# Patient Record
Sex: Female | Born: 1965 | Race: Black or African American | Hispanic: No | Marital: Married | Smoking: Never smoker
Health system: Southern US, Community
[De-identification: ages and names within clinical notes are randomized; demographics above are authoritative.]

## PROBLEM LIST (undated history)

## (undated) DIAGNOSIS — F32A Depression, unspecified: Secondary | ICD-10-CM

## (undated) DIAGNOSIS — I10 Essential (primary) hypertension: Secondary | ICD-10-CM

## (undated) DIAGNOSIS — D649 Anemia, unspecified: Secondary | ICD-10-CM

## (undated) DIAGNOSIS — F419 Anxiety disorder, unspecified: Secondary | ICD-10-CM

## (undated) DIAGNOSIS — R51 Headache: Secondary | ICD-10-CM

## (undated) DIAGNOSIS — F329 Major depressive disorder, single episode, unspecified: Secondary | ICD-10-CM

## (undated) HISTORY — PX: CHOLECYSTECTOMY: SHX55

---

## 1995-08-02 HISTORY — PX: AXILLARY SURGERY: SHX892

## 1998-08-27 ENCOUNTER — Other Ambulatory Visit: Admission: RE | Admit: 1998-08-27 | Discharge: 1998-08-27 | Payer: Self-pay | Admitting: *Deleted

## 1999-01-01 ENCOUNTER — Inpatient Hospital Stay (HOSPITAL_COMMUNITY): Admission: EM | Admit: 1999-01-01 | Discharge: 1999-01-01 | Payer: Self-pay | Admitting: Family Medicine

## 1999-01-19 ENCOUNTER — Encounter: Payer: Self-pay | Admitting: Family Medicine

## 1999-01-19 ENCOUNTER — Ambulatory Visit (HOSPITAL_COMMUNITY): Admission: RE | Admit: 1999-01-19 | Discharge: 1999-01-19 | Payer: Self-pay | Admitting: Family Medicine

## 1999-01-29 ENCOUNTER — Encounter: Admission: RE | Admit: 1999-01-29 | Discharge: 1999-04-29 | Payer: Self-pay | Admitting: Family Medicine

## 1999-09-28 ENCOUNTER — Encounter: Admission: RE | Admit: 1999-09-28 | Discharge: 1999-12-27 | Payer: Self-pay | Admitting: Family Medicine

## 2000-08-01 HISTORY — PX: GASTRIC BYPASS: SHX52

## 2000-09-15 ENCOUNTER — Encounter: Admission: RE | Admit: 2000-09-15 | Discharge: 2000-12-14 | Payer: Self-pay | Admitting: Family Medicine

## 2000-10-17 ENCOUNTER — Ambulatory Visit (HOSPITAL_BASED_OUTPATIENT_CLINIC_OR_DEPARTMENT_OTHER): Admission: RE | Admit: 2000-10-17 | Discharge: 2000-10-17 | Payer: Self-pay | Admitting: Plastic Surgery

## 2001-03-23 ENCOUNTER — Encounter: Admission: RE | Admit: 2001-03-23 | Discharge: 2001-06-21 | Payer: Self-pay | Admitting: Family Medicine

## 2002-02-12 ENCOUNTER — Other Ambulatory Visit: Admission: RE | Admit: 2002-02-12 | Discharge: 2002-02-12 | Payer: Self-pay | Admitting: Family Medicine

## 2002-03-05 ENCOUNTER — Encounter: Admission: RE | Admit: 2002-03-05 | Discharge: 2002-06-03 | Payer: Self-pay | Admitting: Family Medicine

## 2003-02-26 ENCOUNTER — Other Ambulatory Visit: Admission: RE | Admit: 2003-02-26 | Discharge: 2003-02-26 | Payer: Self-pay | Admitting: Family Medicine

## 2003-11-13 ENCOUNTER — Encounter: Admission: RE | Admit: 2003-11-13 | Discharge: 2003-11-13 | Payer: Self-pay | Admitting: Family Medicine

## 2004-03-25 ENCOUNTER — Other Ambulatory Visit: Admission: RE | Admit: 2004-03-25 | Discharge: 2004-03-25 | Payer: Self-pay | Admitting: Family Medicine

## 2004-05-14 ENCOUNTER — Encounter (INDEPENDENT_AMBULATORY_CARE_PROVIDER_SITE_OTHER): Payer: Self-pay | Admitting: Specialist

## 2004-05-14 ENCOUNTER — Ambulatory Visit (HOSPITAL_COMMUNITY): Admission: RE | Admit: 2004-05-14 | Discharge: 2004-05-14 | Payer: Self-pay | Admitting: General Surgery

## 2005-08-17 ENCOUNTER — Other Ambulatory Visit: Admission: RE | Admit: 2005-08-17 | Discharge: 2005-08-17 | Payer: Self-pay | Admitting: Family Medicine

## 2005-09-27 ENCOUNTER — Emergency Department (HOSPITAL_COMMUNITY): Admission: EM | Admit: 2005-09-27 | Discharge: 2005-09-27 | Payer: Self-pay | Admitting: Emergency Medicine

## 2006-08-11 ENCOUNTER — Encounter: Admission: RE | Admit: 2006-08-11 | Discharge: 2006-08-11 | Payer: Self-pay | Admitting: Family Medicine

## 2006-08-22 ENCOUNTER — Encounter: Admission: RE | Admit: 2006-08-22 | Discharge: 2006-10-16 | Payer: Self-pay | Admitting: Family Medicine

## 2007-06-12 ENCOUNTER — Encounter: Admission: RE | Admit: 2007-06-12 | Discharge: 2007-06-12 | Payer: Self-pay | Admitting: Family Medicine

## 2008-03-20 ENCOUNTER — Inpatient Hospital Stay (HOSPITAL_COMMUNITY): Admission: AD | Admit: 2008-03-20 | Discharge: 2008-03-20 | Payer: Self-pay | Admitting: Obstetrics and Gynecology

## 2008-03-28 ENCOUNTER — Inpatient Hospital Stay (HOSPITAL_COMMUNITY): Admission: AD | Admit: 2008-03-28 | Discharge: 2008-03-28 | Payer: Self-pay | Admitting: Obstetrics and Gynecology

## 2008-04-01 ENCOUNTER — Inpatient Hospital Stay (HOSPITAL_COMMUNITY): Admission: AD | Admit: 2008-04-01 | Discharge: 2008-04-03 | Payer: Self-pay | Admitting: Obstetrics and Gynecology

## 2008-04-07 ENCOUNTER — Inpatient Hospital Stay (HOSPITAL_COMMUNITY): Admission: AD | Admit: 2008-04-07 | Discharge: 2008-04-10 | Payer: Self-pay | Admitting: Obstetrics and Gynecology

## 2008-04-08 ENCOUNTER — Encounter (INDEPENDENT_AMBULATORY_CARE_PROVIDER_SITE_OTHER): Payer: Self-pay | Admitting: Obstetrics and Gynecology

## 2008-04-10 ENCOUNTER — Encounter (INDEPENDENT_AMBULATORY_CARE_PROVIDER_SITE_OTHER): Payer: Self-pay | Admitting: Obstetrics and Gynecology

## 2009-11-18 ENCOUNTER — Encounter: Admission: RE | Admit: 2009-11-18 | Discharge: 2009-11-18 | Payer: Self-pay | Admitting: Family Medicine

## 2010-08-22 ENCOUNTER — Encounter: Payer: Self-pay | Admitting: Family Medicine

## 2010-11-01 ENCOUNTER — Emergency Department (HOSPITAL_COMMUNITY): Payer: Managed Care, Other (non HMO)

## 2010-11-01 ENCOUNTER — Emergency Department (HOSPITAL_COMMUNITY)
Admission: EM | Admit: 2010-11-01 | Discharge: 2010-11-01 | Disposition: A | Discharge status: Home / Self Care | Payer: Managed Care, Other (non HMO) | Attending: Emergency Medicine | Admitting: Emergency Medicine

## 2010-11-01 DIAGNOSIS — E860 Dehydration: Secondary | ICD-10-CM | POA: Insufficient documentation

## 2010-11-01 DIAGNOSIS — Z79899 Other long term (current) drug therapy: Secondary | ICD-10-CM | POA: Insufficient documentation

## 2010-11-01 DIAGNOSIS — R112 Nausea with vomiting, unspecified: Secondary | ICD-10-CM | POA: Insufficient documentation

## 2010-11-01 DIAGNOSIS — K5289 Other specified noninfective gastroenteritis and colitis: Secondary | ICD-10-CM | POA: Insufficient documentation

## 2010-11-01 DIAGNOSIS — Z9884 Bariatric surgery status: Secondary | ICD-10-CM | POA: Insufficient documentation

## 2010-11-01 DIAGNOSIS — R10819 Abdominal tenderness, unspecified site: Secondary | ICD-10-CM | POA: Insufficient documentation

## 2010-11-01 DIAGNOSIS — I10 Essential (primary) hypertension: Secondary | ICD-10-CM | POA: Insufficient documentation

## 2010-11-01 DIAGNOSIS — F411 Generalized anxiety disorder: Secondary | ICD-10-CM | POA: Insufficient documentation

## 2010-11-01 DIAGNOSIS — R197 Diarrhea, unspecified: Secondary | ICD-10-CM | POA: Insufficient documentation

## 2010-11-01 LAB — COMPREHENSIVE METABOLIC PANEL
AST: 28 U/L (ref 0–37)
Albumin: 3.6 g/dL (ref 3.5–5.2)
Alkaline Phosphatase: 83 U/L (ref 39–117)
CO2: 23 mEq/L (ref 19–32)
Chloride: 102 mEq/L (ref 96–112)
GFR calc Af Amer: >60 mL/min (ref >60)
Glucose, Bld: 104 mg/dL — ABNORMAL HIGH (ref 70–99)
Potassium: 3.8 mEq/L (ref 3.5–5.1)
Total Protein: 7.2 g/dL (ref 6.0–8.3)

## 2010-11-01 LAB — CBC
HCT: 39.6 % (ref 36.0–46.0)
Hemoglobin: 13.4 g/dL (ref 12.0–15.0)
MCHC: 33.8 g/dL (ref 30.0–36.0)
MCV: 88.4 fL (ref 78.0–100.0)
RBC: 4.48 MIL/uL (ref 3.87–5.11)
RDW: 13.9 % (ref 11.5–15.5)
WBC: 10 10*3/uL (ref 4.0–10.5)

## 2010-11-01 LAB — DIFFERENTIAL
Basophils Absolute: 0 10*3/uL (ref 0.0–0.1)
Eosinophils Relative: 0 % (ref 0–5)
Lymphs Abs: 0.4 10*3/uL — ABNORMAL LOW (ref 0.7–4.0)
Monocytes Absolute: 0.2 10*3/uL (ref 0.1–1.0)
Neutro Abs: 9.3 10*3/uL — ABNORMAL HIGH (ref 1.7–7.7)

## 2010-11-01 LAB — URINE MICROSCOPIC-ADD ON

## 2010-11-01 LAB — URINALYSIS, ROUTINE W REFLEX MICROSCOPIC
Bilirubin Urine: NEGATIVE
Nitrite: NEGATIVE

## 2010-12-14 NOTE — Op Note (Signed)
NAMESHIRLA, HODGKISS NO.:  [id number]   MEDICAL RECORD NO.:  [id number]          PATIENT TYPE:  INP   LOCATION:  9125                          FACILITY:  [facility]   PHYSICIAN:  Janine Limbo, M.D.DATE OF BIRTH:  1966/06/07   DATE OF PROCEDURE:  04/10/2008  DATE OF DISCHARGE:  04/10/2008                               OPERATIVE REPORT   PREOPERATIVE DIAGNOSES:  1. Postpartum day #2.  2. Desires sterilization.   POSTOPERATIVE DIAGNOSES:  1. Postpartum day #2.  2. Desires sterilization.   PROCEDURE:  Postpartum modified [place] bilateral tubal ligation.   SURGEON:  Janine Limbo, MD.   FIRST ASSISTANT:  None.   ANESTHETIC:  General.   DISPOSITION:  Ms. Lasseigne is a 45 year old female, para 2-0-0-3, who had a  vaginal delivery of twins on April 08, 2008.  She desires  sterilization.  She understands the indications for her surgical  procedure and she accepts the risks of, but not limited to, anesthetic  complications, bleeding, infections, possible damage to the surrounding  organs, and possible tubal failure (17/1000).   FINDINGS:  The fallopian tubes were normal bilaterally.   DESCRIPTION OF PROCEDURE:  The patient was taken to the operating room  where a general anesthetic was given.  The patient's abdomen was prepped  with multiple layers of Betadine and then sterilely draped.  The  subumbilical area was injected with 0.5% Marcaine with epinephrine.  A  subumbilical incision was made and carried sharply through the  subcutaneous tissue, the fascia, and the anterior peritoneum.  The left  fallopian tube was identified and followed to its fimbriated end.  A  knuckle of tube was made on the left using a free tie and then a suture  ligature of 0 plain catgut.  The knuckle of tube thus made, was excised.  Hemostasis was adequate.  An identical procedure was carried out on the  opposite side.  Again hemostasis was adequate.  All instruments were  removed.  The anterior peritoneum and the fascia were closed using a  running suture of 0 Vicryl.  The skin was reapproximated using a  subcuticular suture of 4-0 Vicryl.  Sponge, needle, and instrument  counts were correct on 2  occasions.  The estimated blood loss for the procedure was 1 mL.  The  patient tolerated her procedure well.  She was awakened from her  anesthetic without difficulty and taken to the recovery room in stable  condition.  The 2 portions of the fallopian tubes were sent to Pathology  for evaluation.      Janine Limbo, M.D.  Electronically Signed     AVS/MEDQ  D:  04/10/2008  T:  04/11/2008  Job:  [id number]

## 2010-12-14 NOTE — Discharge Summary (Signed)
Chelsey, [name] NO.:  [id number]   MEDICAL RECORD NO.:  [id number]          PATIENT TYPE:  INP   LOCATION:  9153                          [facility]  [facility]   PHYSICIAN:  Hal Morales, Chelsey.D.DATE OF BIRTH:  [birth year]   DATE OF ADMISSION:  04/01/2008  DATE OF DISCHARGE:  04/03/2008                               DISCHARGE SUMMARY   ADMITTING DIAGNOSES:  1. Twin intrauterine gestation at 36-6/7 weeks.  2. Chronic hypertension, rule out superimposed preeclampsia.   DISCHARGE DIAGNOSES:  1. Intrauterine pregnancy, twin gestation, dichorionic, diamniotic, at      37-1/7 weeks.  2. Baby A with unstable fetal lie.  3. Chronic hypertension, stable without medications.  4. Probable gestational thrombocytopenia.  5. The patient with desire for vaginal birth.  6. BPP 8/8 for twin A and twin B on April 02, 2008.  7. Twin B with occasional mild variables.   PROCEDURES:  1. BPPx2 both on September 1 and September 2.  2. External monitoring.  3. Preeclampsia labs on September 1 and 2.   Chelsey [name] is a 45 year old gravida 2, para 1-0-0-1 who presented on the  day of admission at 36-6/7 weeks for [facility] evaluation.  She was seen in  the office as a work-in for increased edema, headache, and was  complaining of contractions every 10 minutes and just generally not  feeling well.  Cervix was 1 to 2 cm long and -2.  She had ultrasound  last on August 26,  showing vertex and breech presentation with  appropriate fetal growth.  Pregnancy has been followed by MD service at  [facility], and pregnancy is remarkable for:  1. Advanced maternal age.  2. Latex allergy.  3. Twins, di-di.  4. History of PIH and now chronic hypertension requiring no      medication.  5. Unsure dates.  6. History of gastric bypass.   Urine was negative for protein at the office.  CBC showed a hemoglobin  of 13.2, platelets were 96,000 on admission.  CMET within normal limits  with the exception of AST  slightly high at 38 and ALT slightly high at  39.  Uric acid was 5.5.  She was admitted to the antenatal unit to begin  a 24-hour urine to further evaluate for preeclampsia.  She was going to  have repeat BPP in the morning of the 2nd, NST every shift and as well  repeat her PIH labs in the morning of September 2.  Later after  admission after supper on the 1st, the patient was doing better.  She  was without complaints.  BPPs after admission:  Twin A was 8/8 and twin  B was 6/8 and got -2 for no fetal breathing movements.  The morning of  September 2, the patient was doing well.  She reported good fetal  movement x2.  She had sporadic mild contractions, no headache, no visual  changes, no right upper quadrant pain.  She was without other  complaints.  Her blood pressures were all less than 140/90, and other  vital signs were stable.  Baby  A was reactive.  Baby B was also reactive  with some occasional variables.  Extremities remained with 3+ edema  bilaterally.  DTRs 1+ and no clonus.  Sonogram repeat showed baby A in  transverse lie with a BPP of 8/8 and normal AFI, and B was vertex with a  BPP of 8/8 and normal AFI.  Her repeat labs:  CMP was normal.  CBC  showed continued decrease in her platelets to 84,000, which the previous  day were 96,000.  Uric acid was stable but slight increase to 5.6 from  5.5, and 24-hour urine continued.  Subsequently 24-hour urine showed a  total protein of 75.  The plan was made to further discuss the patient's  plan of care with Dr. Pennie Rushing, her primary.  She did receive pastoral  care and care management while she was hospitalized.  On September 3, at  37 and 1, the patient was still without complaints.  No strong  contractions.  Her vital signs remained stable.  Blood pressure range  was systolics 126 to 133 over diastolics of 71 to 86.  Cervix was  unchanged, 2 cm, 3%, -2 with a questionable presenting part.  Hemoglobin  on September 3 was 11.7.  NSTs  remained reactive.  Contractions remained  approximately 6 per hour.  Plan of care was discussed with the patient.  The patient verbalized desire for a vaginal delivery if possible.  Since  Baby A was transverse, she decline delivery at this time.  In light of  the absence of other markers of pre-eclampsia, the patient's  thrombocytopenia was thought to be gestational.  On the evening of the  3rd, the patient continued to be without complaints, was ambulating  without difficulty.  Blood pressure after ambulation was 150/77 and a  recheck was 126/57.  The patient was given the option of going home or  remaining on the antenatal unit for continued observation and did voice  desire to go home.  She was deemed to have received full benefit of her  hospital stay and was discharged home in stable condition.   DISCHARGE FOLLOWUP:  She is going to return on Monday for an NST for Dr.  Pennie Rushing to follow up and also to have a repeat platelet count.  Platelet  count is to be on the 4th, which is tomorrow, and the NST is to be on  Monday on the 7th.  She was given labor precautions and fetal kick  counts and to follow up p.r.n.   MEDICATIONS:  She is just on a prenatal vitamin 1 tablet p.o. daily.   The plan of care by Dr. Pennie Rushing was to consider delivery at 38 weeks or  when baby A is in vertex presentation.      Candice Denny Levy, CNM      ______________________________  Hal Morales, Chelsey.D.    CHS/MEDQ  D:  04/03/2008  T:  04/03/2008  Job:  [id number]

## 2010-12-14 NOTE — H&P (Signed)
NAMEADRI, Chelsey [name] NO.:  [id number]   MEDICAL RECORD NO.:  [id number]          PATIENT TYPE:  INP   LOCATION:  9153                          [facility]   PHYSICIAN:  Osborn Coho, M.D.   DATE OF BIRTH:  1965/08/10   DATE OF ADMISSION:  04/01/2008  DATE OF DISCHARGE:  04/03/2008                              HISTORY & PHYSICAL   HISTORY:  Chelsey [name] is a 45 year old gravida 2, para 1-0-0-1 at 36-6/7  weeks who presents today with twins after having been seen in the office  today with mild elevations of blood pressure and headache.   Her pregnancy has been remarkable for:  1. Twin gestation  2. History of PIH with her last pregnancy not requiring any      medication.  3. Advanced maternal age with genetic testing declined.  4. History of chronic hypertension in the past.  5. Latex allergy.  6. Questionable dates with [facility] by first trimester ultrasound on October 23, 2007 at 18 weeks by LMP.  7. Diamniotic dichorionic twins.  8. History of gastric bypass surgery in the past.   LABORATORY DATA:  Prenatal labs:  Blood type is A+, Rh antibody  negative, VDRL nonreactive, rubella titer positive, hepatitis B surface  antigen negative, HIV is nonreactive.  Sickle cell test was negative.  Cystic fibrosis testing was negative.  Pap was done.  GC chlamydia  cultures were done and were negative.  Hemoglobin upon entering the  practice was 11.4.  It was 10.1 at 27 weeks.  Platelet count at new OB  visit was 167.  The patient declined genetic testing.  She had a normal  Glucola.  Her hemoglobin was 10.1 at 28 weeks.  GC cultures are not  noted on the patient's current chart.   HISTORY OF PRESENT PREGNANCY:  The patient entered care at approximately  18 weeks by dates.  However, sizing was approximately 16-17 weeks with  irregular contours.  She had an ultrasound done the day of that first  visit on October 23, 2007 showing diamniotic dichorionic twins.  Blood  pressure at the time of her visit was 112/70.  In light of her history  of chronic hypertension, she had a 24-hour urine done.  She had another  ultrasound done at 19 weeks showing normal growth and normal fluid.  The  patient had a 1-hour Glucola at 22 weeks which was normal.  She had  another ultrasound at 24 weeks showing fundal placenta and again growth  in normal range.  She had bilateral lower extremity edema.  She had been  on Diurex which was caffeine and a nonsteroidal anti-inflammatory.  She  was recommended to stop that.  A 24-hour urine was normal.   At 27 weeks, she had another ultrasound showing A was vertex, B was  breech, normal fluid and normal growth. She began to use compression  hose which helped her legs.  At 33 weeks, she had another ultrasound  showing A vertex, normal fluid and growth, normal BPP and B was in a  breech  position again with normal fluid and normal growth.  NSTs were  begun at approximately 35 weeks.  She had some diarrhea at 36 weeks  which was determined to be likely a gastrointestinal type virus.  She  had some contractions at that time.  She was sent to [facility] for  monitoring.  Cervix at 35 weeks was fingertip, 50%, vertex was high.  She was sent to maternity admissions unit at 35 weeks for sonogram and  BPP secondary to decreased fetal movement.  These findings were normal.  She had another ultrasound at the office at 36 weeks showing A in vertex  presentation, normal fluid, normal growth and B in a breech position.  Cervix at that time was 2, 70% vertex, -2.  She presented to the office  on the morning of April 01, 2008 with a complaint of not feeling  well, headache and increased swelling.  She then was sent to maternity  admissions unit for evaluation.   In maternity admissions unit today, she was noted to have platelet count  of 96, SGOT/SGPT approximately 2 points above the upper limits of  normal.  Uric acid was 5.5.  NST was  reactive.  The patient was having  some contractions at the beginning of the tracing, however, these did  subside.  Her cervix was still 1-2, 60% with the vertex of the first  baby at a -2 station.  The patient also wants a tubal ligation.   OBSTETRICAL HISTORY:  In 1988, she had a vaginal birth of a female infant,  he weighed 8 pounds 3 ounces at 40 weeks.  She was in labor 3 days.  She  had elevated blood pressure, rupture of membranes greater than 24 hours.  The baby had been footling breech, but did turn vertex prior to the  onset of her labor.  She did have PIH in that pregnancy beginning in the  fourth month, but did not require medication.   MEDICAL HISTORY:  She is a previous Depo user.  She reports usual  childhood illnesses.  She does have a history of GI trouble and sees Dr.  Loreta Ave.  She had gastric bypass surgery in 2002.  A cholecystectomy in  1991.  She does have a history of some chronic hypertension following  the birth of her last baby and some irregular cycles.   She is sensitive to latex.  This is not an anaphylactic reaction, more  of an irritation.   FAMILY HISTORY:  Paternal grandmother is deceased from heart disease and  hypertension.  Her mother is hypothyroid and is on medication.  Her  maternal aunt is on dialysis.  Her paternal grandmother had a stroke and  is now deceased.   GENETIC HISTORY:  Remarkable for the patient's age of 41.  She did  decline genetic testing.  Twins do run on both sides of the family.   SOCIAL HISTORY:  The patient is married to the father of the baby.  He  is involved and supportive.  His name is Madelyn Brunner.  The patient has  an associate degree.  She is employed in Clinical biochemist.  Her husband  has some college.  He is a load Designer, television/film set.  She is Tree surgeon.  She  denies any religious affiliation.  She also denies any alcohol, drug or  tobacco use during this pregnancy.  She has been followed by Dr. Pennie Rushing  during this  pregnancy.   PHYSICAL EXAMINATION:  Blood pressures have been  in the 120-140/77-85.  Other vital signs are stable.  HEENT:  Within normal limits.  LUNGS:  Breath sounds are clear.  HEART:  Regular rate and rhythm without murmur.  BREASTS:  Soft and nontender.  ABDOMEN:  Fundal height is approximately 43-44 cm.  Twin A is palpable  in the vertex presentation.  The patient reports on last ultrasound  which was done on March 28, 2008, B was in the breech presentation.  Fetal heart rates are reactive x2.  Initially, there were some very mild  contractions noted approximately every 3 minutes.  However, with the  patient being a rest, these did subside.  Cervix currently is 1-2, 60%  with twin A in vertex presentation at -2 station.  EXTREMITIES:  Deep tendon reflexes are 2+ without clonus.  There is 1-2+  edema noted in the lower extremities.   LABORATORY VALUES:  Please see the chart for these values.  However, the  most significant ones are platelet count of 96 and SGOT/SGPT of  approximately 2 points above upper limits of normal.  Uric acid is also  5.5.  Other CBC findings, comprehensive metabolic panel findings and LDH  were within normal limits.   IMPRESSION:  1. Intrauterine twin pregnancy at 36-6/7 weeks.  2. History of chronic hypertension and mild pregnancy-induced      hypertension with her last pregnancy, now with some mild elevation      of blood pressure sporadically  3. Twin pregnancy with twin A in the vertex presentation.  4. Low platelets and mildly elevated SGOT/SGPT.   PLAN:  1. Admit to [facility] per consult with Dr. Osborn Coho as attending physician.  2. NST q. shift on each twin.  3. A 24-hour urine will be begun today.  4. PIH labs will be repeated in the morning.  5. We will obtain a BPP and AFI today.  6. The patient advised that with the low platelets and mildly elevated      SGOT/SGPT, that we will follow this carefully and  delivery may need      to be accomplished if these laboratory values remain outside normal      limits.  The patient seems to understand this issue and is      agreeable with the plan.      [name], C.N.M.      Osborn Coho, M.D.  Electronically Signed    VLL/MEDQ  D:  04/04/2008  T:  04/04/2008  Job:  [id number]

## 2010-12-14 NOTE — H&P (Signed)
[name], [name] NO.:  [id number]   MEDICAL RECORD NO.:  [id number]          PATIENT TYPE:  INP   LOCATION:  9153                          [facility]   PHYSICIAN:  Chelsey [name], M.D.   DATE OF BIRTH:  09/01/1965   DATE OF ADMISSION:  04/01/2008  DATE OF DISCHARGE:                              HISTORY & PHYSICAL   This is a 45 year old gravida 2, para 1-0-0-1 at 50 and 6/7th weeks with  twins who presented from the office for [facility] evaluation.  She was seen  there for increased edema, headache, and cervix was found to be 1-2 cm  in long.  Her last ultrasound was done on March 26, 2008, showing  vertex breech presentation with appropriate growth.  Pregnancy has been  followed by the MD service and remarkable for:  1. AMA.  2. LATEX allergy.  3. Twins.  4. History of PIH with current chronic hypertension.  5. Unsure dates.  6. History of gastric bypass.   ALLERGIES:  None, except for LATEX.   OB HISTORY:  Remarkable for vaginal delivery in 1988 of a female infant at  58 weeks' gestation, weighing 8 pounds 3 ounces, remarkable for rupture  of membranes greater than 24 hours and hypertension.   PAST MEDICAL HISTORY:  Remarkable for:  1. PIH with her first baby.  2. Childhood varicella.  3. Gastric bypass surgery for obesity.   PAST SURGICAL HISTORY:  Remarkable for gastric bypass in 2002,  cholecystectomy in 1991.   FAMILY HISTORY:  Remarkable for grandmother with heart disease and  hypertension.  Mother with hypothyroidism.  Aunt with renal failure.  Grandmother with stroke.  Genetic history is remarkable for the  patient's age of 13 and family history of twins.   SOCIAL HISTORY:  The patient is married to Chelsey [name], who is involved  and supportive.  She does not report a religious affiliation.  She  denies any alcohol, tobacco, or drug use.   PRENATAL LABS:  Hemoglobin 11.4 and platelets 167.  Blood type A+.  Antibody screen negative.   Sickle cell negative.  RPR nonreactive.  Rubella immune.  Hepatitis negative.  HIV negative.  Cystic fibrosis  negative.   HISTORY OF CURRENT PREGNANCY:  The patient entered care at 18 weeks'  gestation.  She had ultrasound to confirm dates, which revealed a twin,  dichorionic twins.  She had some chronic high blood pressure and 24-hour  urine was done per baseline. measurements showing 56 mg of protein.  Ultrasound at 19 weeks was normal for both twins.  She had another  ultrasound at 24 weeks that showed normal growth.  Glucola at 27 weeks  was normal.  Ultrasound at 33 weeks showed normal growth and BPP shortly  thereafter was also normal for both the babies.  She began twice weekly  NSTs with intermittent BPPs that all remained normal.  Ultrasound at 36  weeks showed normal BPPs in good growth on both babies and group B strep  was done at that time, and results are not currently present on the  chart.  OBJECTIVE DATA:  Vital Signs:  Stable.  Blood pressures range 126-138/78-  95.  HEENT:  Within normal limits.  Thyroid normal, not enlarged.  CHEST:  Clear to auscultation.  HEART:  Heart rate, regular rate and rhythm.  Abdomen:  Gravid.  Fetal monitor denotes reactive fetal heart rate x2  with no contractions.  EXTREMITIES:  Show edema bilaterally in lower  extremities.  DTRs 2+ to 3+.   Urine was negative for protein in the office.  CBC shows hemoglobin 13.2  and platelet count of 96,000.  CMET is all within normal limits except  for AST of 38, ALT of 39.  Uric acid is 5.5.   ASSESSMENT:  1. Intrauterine pregnancy at 14 and 6 weeks with twins.  2. Pregnancy-induced hypertension, rule out preeclampsia.   PLAN:  1. Admit to antenatal.  2. 24-hour urine.  3. Repeat BPPs in the morning.  4. NST every shift.  5. Repeat labs in the morning.      Marie L. Williams, C.N.M.      Chelsey [name], M.D.  Electronically Signed    MLW/MEDQ  D:  04/01/2008  T:  04/02/2008   Job:  [id number]

## 2010-12-14 NOTE — Discharge Summary (Signed)
Chelsey [name], Chelsey [name] NO.:  [id number]   MEDICAL RECORD NO.:  [id number]          PATIENT TYPE:  INP   LOCATION:  9125                          [facility]   PHYSICIAN:  Janine Limbo, M.D.DATE OF BIRTH:  [birth year]   DATE OF ADMISSION:  04/07/2008  DATE OF DISCHARGE:                               DISCHARGE SUMMARY   ADMITTING DIAGNOSES:  1. Intrauterine twin pregnancy at 37-5/7 weeks.  2. Chronic hypertension on no medications.  3. Thrombocytopenia.   DISCHARGE DIAGNOSES:  1. Intrauterine pregnancy with twins at 37 weeks and 5 days.  2. Chronic high blood pressure.  3. Thrombocytopenia.  4. Desires tubal sterilization.   PROCEDURES:  1. Normal spontaneous vaginal birth of twin A and B over an intact      perineum.  2. Postpartum bilateral tubal ligation.   HOSPITAL COURSE:  Chelsey [name] is a 45 year old gravida 2, para 1-0-0-1 at  37-5/7 weeks who presented for an NST and [facility] labs on April 07, 2008.  Pregnancy had been followed by Dr. Pennie Rushing remarkable for;  1. Twin gestation.  2. Latex allergy.  3. PIH chronic hypertension.  4. Advanced maternal age.  5. History of gastric bypass surgery.   The patient had been followed for thrombocytopenia and elevated blood  pressures in the third trimester.  On admission, platelet count was 79.  It had been 95 on April 04, 2008.  There were some questionable  decelerations on twin B.  Her blood pressures were within normal limits.  She did have significant edema.  SGOT at that time was 39, SGPT was 36,  which were slightly elevated.  Cervix 3, 50% vertex, -2 to -3.  BPP was  8/8 for both babies.  Recommendation was made for labor augmentation  since both were now vertex.  The patient was agreeable with that plan.  Platelet count was 70.  On a recheck several hours later, Pitocin was  begun.  Twin A remained reactive.  Twin B did have some deceleration,  some variable, some late.  Cervix 3-4 at that  time.  Artificial rupture  of membranes was accomplished.  However, they remained reassuring  factors.  By 10:00 p.m., the patient was 6 cm, platelet count was 78,000  at 7:30.  The patient then became complete and delivered both twins  vaginally over an intact perineum at  12:30 a.m.  On April 08, 2008,  Twin A weighed 6 pounds 3 ounces, Apgars were 8 and 9.  Baby A was a  female, baby B was a female, weighed 4 pounds.  Apgars were 9 and 9.  The  child did go to NICU secondary to weight.  Baby A went to full-term  nursery.  Social work was contacted secondary to just the baby in NICU.  The patient was found to have good support.  By postpartum day #1, she  was doing well.  She was breast-feeding baby A and going to pump for  baby B.  Her weight was 282 pounds, hemoglobin was 10.8, white blood  cell count 8.2, platelet count was 70.  It had been 78 on the last  examination.  She was doing well.  She was up ad lib.  Her perineum was  clear.  By postpartum day #2, the patient was continuing to do well.  Twin A was feeding well at the breast.  Twin B was doing well in NICU.  The patient vitals that morning, she was expecting a tubal ligation  while she was in the hospital.  This had not been communicated.  Dr.  Stefano Gaul was notified.  The patient was made n.p.o. and the tubal  ligation was performed on the afternoon of April 10, 2008.  At that  point, she was deemed to receive full benefit of her hospital stay and  was discharged home.   DISCHARGE INSTRUCTIONS:  Per [facility] handout.   DISCHARGE MEDICATIONS:  1. Motrin 600 mg p.o. q.6 h. p.r.n. pain.  2. Percocet 5/325 one to two p.o. every 3-4 hours p.r.n. pain.   Discharge followup will occur in 6 weeks, [facility].      [name], C.N.M.      Janine Limbo, M.D.  Electronically Signed    VLL/MEDQ  D:  04/10/2008  T:  04/10/2008  Job:  [id number]

## 2010-12-14 NOTE — H&P (Signed)
[name], [name] NO.:  [id number]   MEDICAL RECORD NO.:  [id number]          PATIENT TYPE:  INP   LOCATION:  9171                          FACILITY:  [facility]   PHYSICIAN:  Hal Morales, M.D.DATE OF BIRTH:  1965/11/04   DATE OF ADMISSION:  04/07/2008  DATE OF DISCHARGE:                              HISTORY & PHYSICAL   This is a 45 year old gravida 2, para 1-0-0-1 at 105 and 5/7th weeks who  presents initially for NST and recheck of her labs due to  thrombocytopenia.  She was seen last week for thrombocytopenia in the  presence of chronic hypertention.  Pregnancy has been followed by Dr.  Pennie Rushing and remarkable for,  1. Twins.  2. LATEX ALLERGY.  3. Chronic hypertension.  4. AMA.  5. History of gastric bypass.   She is being admitted today for twin gestation and unstable lie as well  as chronic hypertension.   ALLERGIES:  LATEX.   OB HISTORY:  Remarkable for vaginal delivery in 1988 of a female infant at  15 weeks' gestation weighing 8 pounds and 3 ounces remarkable for breech  presentation with transition to vertex and hypertension.   MEDICAL HISTORY:  1. Remarkable for PIH with 1st pregnancy.  2. History of childhood varicella.  3. History of gastric bypass with resulting GI blockage.  4. Chronic hypertension, requiring no meds.   SURGICAL HISTORY:  Remarkable for gastric bypass in 2002 and  cholecystectomy in 1991.   FAMILY HISTORY:  Remarkable for grandmother with heart disease and  hypertension, mother with hypothyroidism, aunt with kidney failure, and  grandmother with stroke.   GENETIC HISTORY:  Remarkable for the patient's age of 68 and family  history of twins.   SOCIAL HISTORY:  The patient is married to [place] who is from  [place].  She does not report a religious affiliation.  She denies any  alcohol, tobacco, or drug use.   PRENATAL LABS:  Hemoglobin 11.4, platelets 167.  Blood type A positive.  Antibody screen negative.   Sickle cell negative.  RPR nonreactive.  Rubella immune.  Hepatitis negative.  HIV negative.  Cystic fibrosis  negative.   HISTORY OF CURRENT PREGNANCY:  The patient entered care at 56 weeks'  gestation.  New OB exam revealed twin gestation, diamniotic dichorionic  with viable twins.  She declined genetic screening.  She had a baseline  24-hour urine of 56 mg of protein due to chronic hypertension.  Ultrasound at 19 weeks was normal.  She had another ultrasound at 24  weeks that was normal.  She started having some edema at that time.  Ultrasound at 27 weeks showed normal growth and one at 33 weeks that  showed normal growth.  BPP at 33 weeks was 8/8 on both twins.  She began  NSTs twice a week with intermittent BPPs.  She had some diarrhea at 36  weeks that resolved and she was seen for decreased movement at 36 weeks,  had BPPs that were normal and ultrasounds that were normal.  Cervix was  2 cm at that time and her  group B strep was negative at term.  In the  week or 2, there has been unstable lie of twin B, but they are both  currently vertex today.   OBJECTIVE:  VITAL SIGNS:  Stable.  Blood pressures 109-130 over 73-80.  HEENT:  Within normal limits.  Thyroid normal not enlarged.  CHEST:  Clear to auscultation.  HEART:  Regular rate and rhythm.  ABDOMEN:  Gravid.  NST shows reactivity on both twins with questionable  variable D cells on twin B.  Uterine contractions were every 6 minutes  on admission.  CBC shows hemoglobin of 12, platelet count of 79,000,  which is down from 95,000 on April 04, 2008.  CMET shows a SGOT of  39, SGPT of 36.  Cervix is 3 cm, 50% effaced, -2 to -3 station.  EXTREMITIES:  Show 2-3 plus edema of feet and ankles.   ASSESSMENT:  1. Intrauterine pregnancy of twins at 14 and 5/7th weeks.  2. Chronic hypertension with no medications required and normal blood      pressures.  3. Thrombocytopenia.  4. Unstable lie, now vertex/vertex.   PLAN:  1.  Admission to [facility] per Dr. Pennie Rushing.  2. Amniotomy and induction of labor and further orders to follow.      Marie L. Mayford Knife, C.N.M.      ______________________________  Hal Morales, M.D.    MLW/MEDQ  D:  04/07/2008  T:  04/07/2008  Job:  [id number]

## 2010-12-17 NOTE — Op Note (Signed)
NAMEMITALI, SHENEFIELD NO.:  [id number]   MEDICAL RECORD NO.:  [id number]          PATIENT TYPE:  OIB   LOCATION:  2899                         FACILITY:  [facility]   PHYSICIAN:  Leonie Man, M.D.   DATE OF BIRTH:  August 12, 1965   DATE OF PROCEDURE:  05/14/2004  DATE OF DISCHARGE:                                 OPERATIVE REPORT   PREOPERATIVE DIAGNOSIS:  Epidermoid cyst of right anterior chest wall.   POSTOPERATIVE DIAGNOSIS:  Epidermoid cyst of right anterior chest wall.   PROCEDURE:  Excision of epidermoid cyst of right anterior chest wall.   SURGEON:  Leonie Man, M.D.   ASSISTANT:  Nurse.   ANESTHESIA:  Local MAC.   INDICATIONS FOR PROCEDURE:  The patient is a 45 year old woman with an  enlarging and recurrent infected epidermoid cyst of the anterior chest wall.  She comes to the operating room for a removal at this time after the risks  and potential benefits of surgery have been discussed, questions answered,  and consent obtained.   DESCRIPTION OF PROCEDURE:  Following some sedation, the patient's anterior  chest is prepped and draped to be included in the sterile operative field.  I outlined an elliptical incision around the cyst and then infiltrated the  skin and subcutaneous tissues with 0.5% Marcaine with epinephrine.  The cyst  was then excised and the elliptical incision deepened through the skin and  subcutaneous tissue including the entire capsule of the cyst.  This was  removed and forwarded for pathologic evaluation.  Hemostasis assured with  electrocautery.  Needle, sponge, and instrument counts correct.  Subcutaneous tissues closed with interrupted 0 Vicryl sutures.  Skin closed  with a running 4-0 Monocryl suture and reinforced with Steri-Strips.  Sterile dressing was applied.  The anesthetic was reversed.  The patient was  removed from the operating room to the recovery room in stable condition.  She tolerated the procedure  well.      Patr   PB/MEDQ  D:  05/14/2004  T:  05/14/2004  Job:  [id number]

## 2011-05-04 LAB — URINALYSIS, ROUTINE W REFLEX MICROSCOPIC
Bilirubin Urine: NEGATIVE
Glucose, UA: NEGATIVE
Glucose, UA: NEGATIVE
Hgb urine dipstick: NEGATIVE
Nitrite: NEGATIVE
Protein, ur: NEGATIVE
Specific Gravity, Urine: 1.01
Urobilinogen, UA: 0.2
pH: 5.5

## 2011-05-04 LAB — CBC
HCT: 34.9 — ABNORMAL LOW
HCT: 36.9
Hemoglobin: 10.6 — ABNORMAL LOW
Hemoglobin: 12.8
Hemoglobin: 13.2
MCHC: 33.1
MCHC: 33.2
MCHC: 33.2
MCHC: 33.3
MCHC: 33.4
MCV: 93.4
MCV: 93.5
MCV: 93.6
MCV: 93.8
MCV: 94.1
MCV: 94.4
Platelets: 79 — ABNORMAL LOW
Platelets: 84 — ABNORMAL LOW
Platelets: 96 — ABNORMAL LOW
RBC: 3.41 — ABNORMAL LOW
RBC: 3.44 — ABNORMAL LOW
RBC: 3.75 — ABNORMAL LOW
RBC: 3.95
RBC: 4.14
RBC: 4.22
RDW: 14
RDW: 14.1
RDW: 14.1
RDW: 14.4
WBC: 5.7
WBC: 6.5
WBC: 7.2
WBC: 7.3

## 2011-05-04 LAB — COMPREHENSIVE METABOLIC PANEL
AST: 39 — ABNORMAL HIGH
Albumin: 2.5 — ABNORMAL LOW
Albumin: 2.6 — ABNORMAL LOW
Albumin: 2.8 — ABNORMAL LOW
BUN: 8
Calcium: 8.4
Calcium: 9
Chloride: 107
Creatinine, Ser: 0.67
Creatinine, Ser: 0.68
Creatinine, Ser: 0.7
GFR calc Af Amer: >60
GFR calc Af Amer: >60
GFR calc non Af Amer: >60
Sodium: 134 — ABNORMAL LOW
Sodium: 137
Total Protein: 5.7 — ABNORMAL LOW

## 2011-05-04 LAB — PROTEIN, URINE, 24 HOUR
Collection Interval-UPROT: 24
Protein, Urine: 3

## 2011-05-04 LAB — CREATININE CLEARANCE, URINE, 24 HOUR
Creatinine, 24H Ur: 1715
Creatinine, Urine: 68.6

## 2011-05-04 LAB — RPR: RPR Ser Ql: NONREACTIVE

## 2011-05-04 LAB — URINE MICROSCOPIC-ADD ON

## 2011-05-04 LAB — URIC ACID: Uric Acid, Serum: 5.6

## 2011-05-04 LAB — SAMPLE TO BLOOD BANK

## 2011-05-04 LAB — LACTATE DEHYDROGENASE: LDH: 182

## 2011-07-08 ENCOUNTER — Ambulatory Visit (INDEPENDENT_AMBULATORY_CARE_PROVIDER_SITE_OTHER): Payer: Managed Care, Other (non HMO) | Admitting: *Deleted

## 2011-07-08 DIAGNOSIS — R609 Edema, unspecified: Secondary | ICD-10-CM

## 2011-07-11 ENCOUNTER — Other Ambulatory Visit: Payer: Managed Care, Other (non HMO)

## 2011-07-18 NOTE — Procedures (Unsigned)
DUPLEX DEEP VENOUS EXAM - UPPER EXTREMITY  INDICATION:  Right hand edema and numbness.  HISTORY:  Edema:  Yes. Trauma/Surgery:  No. Pain:  Right hand. PE:  No. Previous DVT:  No. Anticoagulants:  No. Other:  DUPLEX EXAM:                                            Bas/               IJV   SCV     AXV    BrachV  Ceph V               R  L  R   L   R  L   R   L   R  L Thrombosis    o  o  o   o   o      o       o Spontaneous   +  +  +   +   +      +       + Phasic        +  +  +   +   +      +       + Augmentation  +  +  +   +   +      +       + Compressible  +  +  +   +   +      + Competent     +  +  +   +   +      + Legend:  + - yes  o - no  p - partial  D - decreased  COMPRESSIBLE BAS/CEPH RIGHT:  +  COMPETENT BAS/CEPH RIGHT:  +  IMPRESSION:  No evidence of right upper extremity deep venous thrombosis.  Preliminary results were called to Dr. Stark Jock office.     ___________________________________________ Fransisco Hertz, MD  EM/MEDQ  D:  07/08/2011  T:  07/08/2011  Job:  [id number]

## 2011-08-17 ENCOUNTER — Other Ambulatory Visit: Payer: Self-pay | Admitting: Obstetrics and Gynecology

## 2011-08-20 ENCOUNTER — Encounter (HOSPITAL_COMMUNITY): Payer: Self-pay | Admitting: Pharmacist

## 2011-08-26 NOTE — H&P (Signed)
NAMELIVIA, [name] NO.:  [id number]  MEDICAL RECORD NO.:  [id number]  LOCATION:  PERIO                         FACILITY:  [facility]  PHYSICIAN:  Vanessa P. Haygood, M.D.  DATE OF BIRTH:  10-07-1965  DATE OF ADMISSION:  08/16/2011 DATE OF DISCHARGE:                             HISTORY & PHYSICAL   HISTORY OF PRESENT ILLNESS:  [name] is a 46 year old married African American female, gravida 2, para 3-0-0-3, presenting for a Robot-assisted total laparoscopic hysterectomy because of menorrhagia and chronic pelvic pain.  The patient reports that for the past few years, she has had dull, nagging pelvic pain for  most days of the month,  that she rates as a 4/10 on a 10-point pain scale.  Additionally, she will experience severe cramping rated as a 10/10 on a 10-point pain scale during her 16 day  menstrual flow.  Over the past year, her periods  have increased in volume, with the need to change a pad every 1-4 hours. She occasionally will spot between her menstrual periods for 3 days, but  denies any dyspareunia, dysuria, or changes in her bowel movements.  The patient has been seen by a gastroenterologist for her chronic pelvic pain; however, her evaluation has not yielded a diagnosis.    Pelvic ultrasound in December 2012, showed a uterus measuring 12.11 x 8.50 x 7.71 cm, and a large fundal submucosal fibroid measuring 6.2 x 5.7 x 5.8 cm that displaced the endometrium.  A followup sonohysterogram further revealed  an endometrial polyp measuring 0.829 x 1.03 x 3.41 cm with the uterine cavity dimensions being 2.55 x 4.38 cm.  The patient had a normal CBC and thyroid panel in December 2012.  A review of both medical and surgical management options were given to the patient. However, given the chronicity  and disruptive nature of her symptoms, she has decided to proceed with definitive therapy in the form of hysterectomy.  PAST MEDICAL HISTORY:  OB HISTORY:  Gravida 2, para  3-0-0-3.  The patient had a spontaneous vaginal birth in 12 (infant weighed 8 and pounds 3 ounces) and twins in 2009.  GYN HISTORY:  Menarche at 46 year old.  Last menstrual period on August 08, 2011.  She uses tubal ligation for contraception.  Denies any history of sexually transmitted diseases or abnormal Pap smears.  Her last normal Pap smear was December 2012.  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  She sees Dr. Craige Cotta at [facility] who has given her the options of physical therapy vs steroid injections.  She has chosen physical therapy for her symptoms of right hand numbness and pain, and has chosen to start this after recovery from her surgery.  Hypertension.  Possible irritable bowel syndrome.   Migraine Headaches.  Anxiety and bipolar disorder.  SURGERY:  In 1990, cholecystectomy.   In 2002, gastric bypass surgery. In 2009, bilateral tubal ligation.   Denies any problems of anesthesia or history of blood transfusions.  FAMILY HISTORY:  Hypertension, stroke, cardiovascular disease, and thyroid disease.  SOCIAL HISTORY:  Patient is married and she works as a Engineer, site at [employer].  HABITS:  She does not use tobacco or illicit drugs.  She does admit to occasional alcohol intake.  CURRENT MEDICATIONS: 1. MiraLax as directed. 2. Hydrochlorothiazide 25 mg daily. 3. Calcium daily. 4. Lithium 300 mg 2 tablets at bedtime. 5. Multivitamins daily. 6. Bupropion 150 mg daily. 7. Multivitamins. 8. Potassium. 9. Vitamin D. 10.Biotin, all those taken daily. 11.Potassium 99 mg a day. 12.Tramadol 50 mg every 6 hours as needed for pain. 13.Percocet 5/325 1-2 tablets every 6 hours as needed for pain.  The patient has no known drug allergies.  She denies any sensitivities to latex, shellfish, peanuts, or soy.  REVIEW OF SYSTEMS:  The patient does wear contact lenses.  Occasionally will have headaches.  Will  on occasion experienced nausea, constipation. Neck pain is more like a pulling sensation. Right-hand numbness and pain are intermittent.  She denies, however, any vision changes, chest pain, shortness of breath, vomiting, diarrhea, urinary frequency, urgency, hematuria, joint swelling, myalgias, arthralgias, skin rashes, and except as is mentioned in the history of present illness, the patient's review of systems is otherwise negative.  PHYSICAL EXAMINATION:  VITAL SIGNS:  Blood pressure 112/80, pulse is 74, respiration 14, temperature 98.6 degrees Fahrenheit orally.  Weight 272 pounds.  Height 5 feet and 7 inches tall.  Body mass index is 42.6. NECK:  Supple without masses.  There is no thyromegaly or cervical adenopathy. HEART:  Regular rate and rhythm. LUNGS:  Clear. BACK:  No CVA tenderness. ABDOMEN:  With left lower quadrant tenderness and voluntary guarding, but no rebound.  There are no palpable masses or organomegaly. EXTREMITIES:  No clubbing, cyanosis, or edema. PELVIC:  EG BUS is normal.  Vagina is normal.  Cervix is nontender without lesions.  Uterus appears 10-12 weeks size, though the patient's exam is limited by body habitus.  Adnexa without tenderness or masses.  Endometrial biopsy was performed at pre-operative visit-results are  Normal.  IMPRESSION: 1. Menorrhagia. 2. Pelvic pain.  DISPOSITION:  A discussion was held with the patient regarding the indications for her procedure along with its risks, which include, but are not limited to reaction to anesthesia, damage to adjacent organs, infection, excessive bleeding, and the possible need for an open abdominal incision.  The patient further understands that this procedure may not totally eliminate her pelvic pain.  She was also given a MiraLax bowel prep to be completed 24 hours prior to her procedure.  She was Advised that the robotic approach to her surgery requires more time than that of an open abdominal  incision, that her hospital stay is expected to be 0-2 days, that she will experience transient post-operative facial edema and that it is expected that she should be able to return to her usual activities within 2-3 week (except for intercourse which would be delayed until 6 weeks. The patient verbalized understanding of these risks and preoperative preparation and  has consented to proceed with a robot-assisted total laparoscopic Hysterectomy with the knowledge that an open approach may be necessary for completion of the procedure.  This will be done at [facility] on September 02, 2011, at 7:30  o'clock a.m.      Chelsey [name], P.A.-C _________________________ Maris Berger. Pennie Rushing, M.D.     EJP/MEDQ  D:  08/25/2011  T:  08/26/2011  Job:  [id number]

## 2011-08-31 ENCOUNTER — Encounter (HOSPITAL_COMMUNITY): Payer: Self-pay

## 2011-08-31 ENCOUNTER — Encounter (HOSPITAL_COMMUNITY)
Admission: RE | Admit: 2011-08-31 | Discharge: 2011-08-31 | Disposition: A | Discharge status: Home / Self Care | Payer: Managed Care, Other (non HMO) | Source: Ambulatory Visit | Attending: Obstetrics and Gynecology | Admitting: Obstetrics and Gynecology

## 2011-08-31 ENCOUNTER — Other Ambulatory Visit (HOSPITAL_COMMUNITY): Payer: Self-pay | Admitting: Obstetrics and Gynecology

## 2011-08-31 ENCOUNTER — Other Ambulatory Visit: Payer: Self-pay | Admitting: Obstetrics and Gynecology

## 2011-08-31 ENCOUNTER — Other Ambulatory Visit: Payer: Self-pay

## 2011-08-31 HISTORY — DX: Essential (primary) hypertension: I10

## 2011-08-31 HISTORY — DX: Depression, unspecified: F32.A

## 2011-08-31 HISTORY — DX: Anxiety disorder, unspecified: F41.9

## 2011-08-31 HISTORY — DX: Headache: R51

## 2011-08-31 HISTORY — DX: Major depressive disorder, single episode, unspecified: F32.9

## 2011-08-31 HISTORY — DX: Anemia, unspecified: D64.9

## 2011-08-31 LAB — BASIC METABOLIC PANEL
BUN: 10 mg/dL (ref 6–23)
Calcium: 8.6 mg/dL (ref 8.4–10.5)
GFR calc Af Amer: >90 mL/min (ref >90)
GFR calc non Af Amer: >90 mL/min (ref >90)
Potassium: 3.6 mEq/L (ref 3.5–5.1)
Sodium: 138 mEq/L (ref 135–145)

## 2011-08-31 LAB — CBC
Platelets: 194 10*3/uL (ref 150–400)
RBC: 4.05 MIL/uL (ref 3.87–5.11)
RDW: 14.1 % (ref 11.5–15.5)
WBC: 8.2 10*3/uL (ref 4.0–10.5)

## 2011-08-31 LAB — SURGICAL PCR SCREEN: MRSA, PCR: NEGATIVE

## 2011-08-31 NOTE — Patient Instructions (Addendum)
20 Chelsey [name]  08/31/2011   Your procedure is scheduled on:  09/02/11  Enter through the Main Entrance of [facility] at 6 AM.  Pick up the phone at the desk and dial [phone].   Call this number if you have problems the morning of surgery: [phone]   Remember:   Do not eat food:After Midnight.  Do not drink clear liquids: After Midnight.  Take these medicines the morning of surgery with A SIP OF WATER: Blood Pressure medication, Wellbutrin, Lithium and Prednisone early as possible with small amount of water.   Do not wear jewelry, make-up or nail polish.  Do not wear lotions, powders, or perfumes. You may wear deodorant.  Do not shave 48 hours prior to surgery.  Do not bring valuables to the hospital.  Contacts, dentures or bridgework may not be worn into surgery.  Leave suitcase in the car. After surgery it may be brought to your room.  For patients admitted to the hospital, checkout time is 11:00 AM the day of discharge.   Patients discharged the day of surgery will not be allowed to drive home.  Name and phone number of your driver: NA  Special Instructions: CHG Shower Use Special Wash: 1/2 bottle night before surgery and 1/2 bottle morning of surgery.   Please read over the following fact sheets that you were given: MRSA Information

## 2011-08-31 NOTE — Pre-Procedure Instructions (Signed)
EKG of 08/31/11 accepted by Brayton Caves, MD. No orders given.  Patton Salles, RN

## 2011-09-01 MED ORDER — DEXTROSE 5 % IV SOLN
2.0000 g | INTRAVENOUS | Status: AC
  Administered 2011-09-02: 2 g via INTRAVENOUS
  Filled 2011-09-01: qty 2

## 2011-09-01 NOTE — Anesthesia Preprocedure Evaluation (Addendum)
Anesthesia Evaluation  Patient identified by MRN, date of birth, ID band Patient awake    Reviewed: Allergy & Precautions, H&P , NPO status , Patient's Chart, lab work & pertinent test results  Airway       Dental   Pulmonary neg pulmonary ROS,          Cardiovascular hypertension, Pt. on medications     Neuro/Psych  Headaches, Anxiety Depression Bipolar Disorder    GI/Hepatic negative GI ROS, Neg liver ROS,   Endo/Other  Morbid obesity  Renal/GU negative Renal ROS  Genitourinary negative   Musculoskeletal negative musculoskeletal ROS (+)   Abdominal   Peds  Hematology negative hematology ROS (+)   Anesthesia Other Findings   Reproductive/Obstetrics negative OB ROS                           Anesthesia Physical Anesthesia Plan  ASA: III  Anesthesia Plan: General   Post-op Pain Management:    Induction: Intravenous  Airway Management Planned: Oral ETT  Additional Equipment:   Intra-op Plan:   Post-operative Plan: Extubation in OR  Informed Consent: I have reviewed the patients History and Physical, chart, labs and discussed the procedure including the risks, benefits and alternatives for the proposed anesthesia with the patient or authorized representative who has indicated his/her understanding and acceptance.   Dental advisory given  Plan Discussed with: CRNA and Anesthesiologist  Anesthesia Plan Comments:        discussed in particular the patient's neck pain/disc issues and risk of general anesthesia versus neruaxial.  She understands and wishes to proceed. Anesthesia Quick Evaluation

## 2011-09-02 ENCOUNTER — Ambulatory Visit (HOSPITAL_COMMUNITY)
Admission: RE | Admit: 2011-09-02 | Discharge: 2011-09-03 | Disposition: A | Discharge status: Home / Self Care | Payer: Managed Care, Other (non HMO) | Source: Ambulatory Visit | Attending: Obstetrics and Gynecology | Admitting: Obstetrics and Gynecology

## 2011-09-02 ENCOUNTER — Other Ambulatory Visit: Payer: Self-pay | Admitting: Obstetrics and Gynecology

## 2011-09-02 ENCOUNTER — Encounter (HOSPITAL_COMMUNITY): Payer: Self-pay | Admitting: Anesthesiology

## 2011-09-02 ENCOUNTER — Ambulatory Visit (HOSPITAL_COMMUNITY): Payer: Managed Care, Other (non HMO) | Admitting: Anesthesiology

## 2011-09-02 ENCOUNTER — Encounter (HOSPITAL_COMMUNITY)
Admission: RE | Disposition: A | Discharge status: Home / Self Care | Payer: Self-pay | Source: Ambulatory Visit | Attending: Obstetrics and Gynecology

## 2011-09-02 DIAGNOSIS — R102 Pelvic and perineal pain unspecified side: Secondary | ICD-10-CM | POA: Diagnosis present

## 2011-09-02 DIAGNOSIS — D252 Subserosal leiomyoma of uterus: Secondary | ICD-10-CM | POA: Insufficient documentation

## 2011-09-02 DIAGNOSIS — D251 Intramural leiomyoma of uterus: Secondary | ICD-10-CM | POA: Insufficient documentation

## 2011-09-02 DIAGNOSIS — N949 Unspecified condition associated with female genital organs and menstrual cycle: Secondary | ICD-10-CM | POA: Insufficient documentation

## 2011-09-02 DIAGNOSIS — Z01812 Encounter for preprocedural laboratory examination: Secondary | ICD-10-CM | POA: Insufficient documentation

## 2011-09-02 DIAGNOSIS — Z01818 Encounter for other preprocedural examination: Secondary | ICD-10-CM | POA: Insufficient documentation

## 2011-09-02 DIAGNOSIS — N920 Excessive and frequent menstruation with regular cycle: Secondary | ICD-10-CM | POA: Diagnosis present

## 2011-09-02 DIAGNOSIS — F3131 Bipolar disorder, current episode depressed, mild: Secondary | ICD-10-CM | POA: Clinically undetermined

## 2011-09-02 DIAGNOSIS — E66813 Obesity, class 3: Secondary | ICD-10-CM | POA: Diagnosis present

## 2011-09-02 DIAGNOSIS — D219 Benign neoplasm of connective and other soft tissue, unspecified: Secondary | ICD-10-CM | POA: Diagnosis present

## 2011-09-02 DIAGNOSIS — Z6841 Body Mass Index (BMI) 40.0 and over, adult: Secondary | ICD-10-CM | POA: Insufficient documentation

## 2011-09-02 SURGERY — ROBOTIC ASSISTED TOTAL HYSTERECTOMY
Anesthesia: General | Site: Abdomen | Wound class: Clean Contaminated

## 2011-09-02 MED ORDER — EPHEDRINE 5 MG/ML INJ
INTRAVENOUS | Status: AC
  Filled 2011-09-02: qty 10

## 2011-09-02 MED ORDER — NEOSTIGMINE METHYLSULFATE 1 MG/ML IJ SOLN
INTRAMUSCULAR | Status: DC | PRN
  Administered 2011-09-02: 4 mg via INTRAVENOUS

## 2011-09-02 MED ORDER — DEXAMETHASONE SODIUM PHOSPHATE 10 MG/ML IJ SOLN
INTRAMUSCULAR | Status: DC | PRN
  Administered 2011-09-02: 10 mg via INTRAVENOUS

## 2011-09-02 MED ORDER — FENTANYL CITRATE 0.05 MG/ML IJ SOLN
INTRAMUSCULAR | Status: AC
  Filled 2011-09-02: qty 2

## 2011-09-02 MED ORDER — LITHIUM CARBONATE 300 MG PO CAPS
300.0000 mg | ORAL_CAPSULE | Freq: Every day | ORAL | Status: DC
  Administered 2011-09-03: 300 mg via ORAL
  Filled 2011-09-02: qty 1

## 2011-09-02 MED ORDER — KETOROLAC TROMETHAMINE 30 MG/ML IJ SOLN
30.0000 mg | Freq: Four times a day (QID) | INTRAMUSCULAR | Status: DC
  Administered 2011-09-02 – 2011-09-03 (×2): 30 mg via INTRAVENOUS
  Filled 2011-09-02 (×2): qty 1

## 2011-09-02 MED ORDER — HYDROCHLOROTHIAZIDE 25 MG PO TABS
25.0000 mg | ORAL_TABLET | Freq: Every day | ORAL | Status: DC
  Administered 2011-09-03: 25 mg via ORAL
  Filled 2011-09-02: qty 1

## 2011-09-02 MED ORDER — ROCURONIUM BROMIDE 50 MG/5ML IV SOLN
INTRAVENOUS | Status: AC
  Filled 2011-09-02: qty 2

## 2011-09-02 MED ORDER — METOCLOPRAMIDE HCL 5 MG/ML IJ SOLN: 10.0000 mg | Freq: Once | INTRAMUSCULAR | Status: DC | PRN

## 2011-09-02 MED ORDER — DEXAMETHASONE SODIUM PHOSPHATE 10 MG/ML IJ SOLN
INTRAMUSCULAR | Status: AC
  Filled 2011-09-02: qty 1

## 2011-09-02 MED ORDER — STERILE WATER FOR IRRIGATION IR SOLN
Status: DC | PRN
  Administered 2011-09-02: 1000 mL

## 2011-09-02 MED ORDER — IBUPROFEN 600 MG PO TABS: 600.0000 mg | ORAL_TABLET | Freq: Four times a day (QID) | ORAL | Status: AC

## 2011-09-02 MED ORDER — PHENYLEPHRINE 40 MCG/ML (10ML) SYRINGE FOR IV PUSH (FOR BLOOD PRESSURE SUPPORT)
PREFILLED_SYRINGE | INTRAVENOUS | Status: AC
  Filled 2011-09-02: qty 5

## 2011-09-02 MED ORDER — ONDANSETRON HCL 4 MG/2ML IJ SOLN
INTRAMUSCULAR | Status: AC
  Filled 2011-09-02: qty 2

## 2011-09-02 MED ORDER — HETASTARCH-ELECTROLYTES 6 % IV SOLN
INTRAVENOUS | Status: AC
  Filled 2011-09-02: qty 500

## 2011-09-02 MED ORDER — SILVER SULFADIAZINE 1 % EX CREA
TOPICAL_CREAM | CUTANEOUS | Status: AC
  Filled 2011-09-02: qty 50

## 2011-09-02 MED ORDER — EPHEDRINE SULFATE 50 MG/ML IJ SOLN
INTRAMUSCULAR | Status: DC | PRN
  Administered 2011-09-02: 10 mg via INTRAVENOUS

## 2011-09-02 MED ORDER — PROMETHAZINE HCL 25 MG PO TABS: 12.5000 mg | ORAL_TABLET | Freq: Four times a day (QID) | ORAL | Status: DC | PRN

## 2011-09-02 MED ORDER — DOCUSATE SODIUM 100 MG PO CAPS: 100.0000 mg | ORAL_CAPSULE | Freq: Two times a day (BID) | ORAL | Status: DC | PRN

## 2011-09-02 MED ORDER — LIDOCAINE HCL (CARDIAC) 20 MG/ML IV SOLN
INTRAVENOUS | Status: DC | PRN
  Administered 2011-09-02: 60 mg via INTRAVENOUS

## 2011-09-02 MED ORDER — MEPERIDINE HCL 25 MG/ML IJ SOLN: 6.2500 mg | INTRAMUSCULAR | Status: DC | PRN

## 2011-09-02 MED ORDER — PROMETHAZINE HCL 12.5 MG PO TABS: 12.5000 mg | ORAL_TABLET | Freq: Four times a day (QID) | ORAL | Status: AC | PRN

## 2011-09-02 MED ORDER — LITHIUM CARBONATE 300 MG PO CAPS
600.0000 mg | ORAL_CAPSULE | Freq: Every day | ORAL | Status: DC
  Administered 2011-09-02: 600 mg via ORAL
  Filled 2011-09-02: qty 2

## 2011-09-02 MED ORDER — LACTATED RINGERS IV SOLN
INTRAVENOUS | Status: DC
  Administered 2011-09-02 (×2): via INTRAVENOUS

## 2011-09-02 MED ORDER — FENTANYL CITRATE 0.05 MG/ML IJ SOLN
25.0000 ug | INTRAMUSCULAR | Status: DC | PRN
  Administered 2011-09-02 (×2): 50 ug via INTRAVENOUS

## 2011-09-02 MED ORDER — FENTANYL CITRATE 0.05 MG/ML IJ SOLN
INTRAMUSCULAR | Status: AC
  Filled 2011-09-02: qty 5

## 2011-09-02 MED ORDER — PROPOFOL 10 MG/ML IV EMUL
INTRAVENOUS | Status: AC
  Filled 2011-09-02: qty 20

## 2011-09-02 MED ORDER — MIDAZOLAM HCL 5 MG/5ML IJ SOLN
INTRAMUSCULAR | Status: DC | PRN
  Administered 2011-09-02: 2 mg via INTRAVENOUS

## 2011-09-02 MED ORDER — BUPROPION HCL ER (XL) 150 MG PO TB24
150.0000 mg | ORAL_TABLET | Freq: Every day | ORAL | Status: DC
  Administered 2011-09-03: 150 mg via ORAL
  Filled 2011-09-02 (×2): qty 1

## 2011-09-02 MED ORDER — OXYCODONE-ACETAMINOPHEN 5-325 MG PO TABS: 1.0000 | ORAL_TABLET | ORAL | Status: DC | PRN

## 2011-09-02 MED ORDER — DOCUSATE SODIUM 100 MG PO CAPS: 100.0000 mg | ORAL_CAPSULE | Freq: Three times a day (TID) | ORAL | Status: AC | PRN

## 2011-09-02 MED ORDER — NEOSTIGMINE METHYLSULFATE 1 MG/ML IJ SOLN
INTRAMUSCULAR | Status: AC
  Filled 2011-09-02: qty 10

## 2011-09-02 MED ORDER — HETASTARCH-ELECTROLYTES 6 % IV SOLN
INTRAVENOUS | Status: DC | PRN
  Administered 2011-09-02: 11:00:00 via INTRAVENOUS

## 2011-09-02 MED ORDER — BACITRACIN-NEOMYCIN-POLYMYXIN 400-5-5000 EX OINT
TOPICAL_OINTMENT | Freq: Once | CUTANEOUS | Status: DC
  Filled 2011-09-02: qty 1

## 2011-09-02 MED ORDER — PROPOFOL 10 MG/ML IV EMUL
INTRAVENOUS | Status: DC | PRN
  Administered 2011-09-02: 200 mg via INTRAVENOUS
  Administered 2011-09-02: 5 mg via INTRAVENOUS
  Administered 2011-09-02: 50 mg via INTRAVENOUS

## 2011-09-02 MED ORDER — KETOROLAC TROMETHAMINE 30 MG/ML IJ SOLN
INTRAMUSCULAR | Status: AC
  Filled 2011-09-02: qty 1

## 2011-09-02 MED ORDER — TRAMADOL HCL 50 MG PO TABS: 50.0000 mg | ORAL_TABLET | ORAL | Status: DC | PRN

## 2011-09-02 MED ORDER — KETOROLAC TROMETHAMINE 30 MG/ML IJ SOLN: 30.0000 mg | Freq: Four times a day (QID) | INTRAMUSCULAR | Status: DC

## 2011-09-02 MED ORDER — IBUPROFEN 600 MG PO TABS: 600.0000 mg | ORAL_TABLET | Freq: Four times a day (QID) | ORAL | Status: DC | PRN

## 2011-09-02 MED ORDER — GLYCOPYRROLATE 0.2 MG/ML IJ SOLN
INTRAMUSCULAR | Status: DC | PRN
  Administered 2011-09-02: .8 mg via INTRAVENOUS
  Administered 2011-09-02: 0.2 mg via INTRAVENOUS

## 2011-09-02 MED ORDER — SCOPOLAMINE 1 MG/3DAYS TD PT72: 1.0000 | MEDICATED_PATCH | Freq: Once | TRANSDERMAL | Status: DC

## 2011-09-02 MED ORDER — LIDOCAINE HCL (CARDIAC) 20 MG/ML IV SOLN
INTRAVENOUS | Status: AC
  Filled 2011-09-02: qty 5

## 2011-09-02 MED ORDER — LACTATED RINGERS IR SOLN
Status: DC | PRN
  Administered 2011-09-02: 3000 mL

## 2011-09-02 MED ORDER — MENTHOL 3 MG MT LOZG: 1.0000 | LOZENGE | OROMUCOSAL | Status: DC | PRN

## 2011-09-02 MED ORDER — LACTATED RINGERS IV SOLN
INTRAVENOUS | Status: DC
  Administered 2011-09-02: 21:00:00 via INTRAVENOUS

## 2011-09-02 MED ORDER — ONDANSETRON HCL 4 MG PO TABS: 4.0000 mg | ORAL_TABLET | Freq: Three times a day (TID) | ORAL | Status: DC | PRN

## 2011-09-02 MED ORDER — ROCURONIUM BROMIDE 100 MG/10ML IV SOLN
INTRAVENOUS | Status: DC | PRN
  Administered 2011-09-02 (×4): 20 mg via INTRAVENOUS
  Administered 2011-09-02: 50 mg via INTRAVENOUS
  Administered 2011-09-02 (×2): 10 mg via INTRAVENOUS
  Administered 2011-09-02: 20 mg via INTRAVENOUS
  Administered 2011-09-02: 10 mg via INTRAVENOUS
  Administered 2011-09-02 (×2): 20 mg via INTRAVENOUS
  Administered 2011-09-02: 10 mg via INTRAVENOUS

## 2011-09-02 MED ORDER — BUPIVACAINE HCL (PF) 0.25 % IJ SOLN
INTRAMUSCULAR | Status: AC
  Filled 2011-09-02: qty 30

## 2011-09-02 MED ORDER — ONDANSETRON HCL 4 MG/2ML IJ SOLN
INTRAMUSCULAR | Status: DC | PRN
  Administered 2011-09-02: 4 mg via INTRAVENOUS

## 2011-09-02 MED ORDER — FENTANYL CITRATE 0.05 MG/ML IJ SOLN
INTRAMUSCULAR | Status: DC | PRN
  Administered 2011-09-02 (×3): 50 ug via INTRAVENOUS
  Administered 2011-09-02: 100 ug via INTRAVENOUS
  Administered 2011-09-02 (×2): 50 ug via INTRAVENOUS

## 2011-09-02 MED ORDER — KETOROLAC TROMETHAMINE 30 MG/ML IJ SOLN
INTRAMUSCULAR | Status: DC | PRN
  Administered 2011-09-02: 30 mg via INTRAVENOUS

## 2011-09-02 MED ORDER — ARTIFICIAL TEARS OP OINT
TOPICAL_OINTMENT | OPHTHALMIC | Status: DC | PRN
  Administered 2011-09-02: 1 via OPHTHALMIC

## 2011-09-02 MED ORDER — PHENYLEPHRINE HCL 10 MG/ML IJ SOLN
INTRAMUSCULAR | Status: DC | PRN
  Administered 2011-09-02: 80 ug via INTRAVENOUS

## 2011-09-02 MED ORDER — BUPIVACAINE HCL (PF) 0.25 % IJ SOLN
INTRAMUSCULAR | Status: DC | PRN
  Administered 2011-09-02: 20 mL

## 2011-09-02 MED ORDER — SILVER NITRATE-POT NITRATE 75-25 % EX MISC
CUTANEOUS | Status: AC
  Filled 2011-09-02: qty 1

## 2011-09-02 MED ORDER — MIDAZOLAM HCL 2 MG/2ML IJ SOLN
INTRAMUSCULAR | Status: AC
  Filled 2011-09-02: qty 2

## 2011-09-02 SURGICAL SUPPLY — 56 items
BAG URINE DRAINAGE (UROLOGICAL SUPPLIES) ×3 IMPLANT
BARRIER ADHS 3X4 INTERCEED (GAUZE/BANDAGES/DRESSINGS) IMPLANT
CABLE HIGH FREQUENCY MONO STRZ (ELECTRODE) ×3 IMPLANT
CANISTER SUCTION 2500CC (MISCELLANEOUS) ×3 IMPLANT
CATH FOLEY 3WAY  5CC 16FR (CATHETERS) ×1
CATH FOLEY 3WAY 5CC 16FR (CATHETERS) ×2 IMPLANT
CHLORAPREP W/TINT 26ML (MISCELLANEOUS) ×3 IMPLANT
CLOTH BEACON ORANGE TIMEOUT ST (SAFETY) ×3 IMPLANT
CONT PATH 16OZ SNAP LID 3702 (MISCELLANEOUS) ×3 IMPLANT
CORDS BIPOLAR (ELECTRODE) ×3 IMPLANT
COVER MAYO STAND STRL (DRAPES) ×3 IMPLANT
COVER TABLE BACK 60X90 (DRAPES) ×6 IMPLANT
COVER TIP SHEARS 8 DVNC (MISCELLANEOUS) ×2 IMPLANT
COVER TIP SHEARS 8MM DA VINCI (MISCELLANEOUS) ×1
DERMABOND ADVANCED (GAUZE/BANDAGES/DRESSINGS) ×1
DERMABOND ADVANCED .7 DNX12 (GAUZE/BANDAGES/DRESSINGS) ×2 IMPLANT
DRAPE HUG U DISPOSABLE (DRAPE) ×3 IMPLANT
DRAPE LG THREE QUARTER DISP (DRAPES) ×6 IMPLANT
DRAPE PROXIMA HALF (DRAPES) ×3 IMPLANT
DRAPE WARM FLUID 44X44 (DRAPE) ×3 IMPLANT
EVACUATOR SMOKE 8.L (FILTER) ×3 IMPLANT
GAUZE SPONGE 4X4 16PLY XRAY LF (GAUZE/BANDAGES/DRESSINGS) ×3 IMPLANT
GLOVE BIO SURGEON STRL SZ 6.5 (GLOVE) ×12 IMPLANT
GLOVE BIOGEL PI IND STRL 6.5 (GLOVE) ×2 IMPLANT
GLOVE BIOGEL PI INDICATOR 6.5 (GLOVE) ×1
GLOVE SURG SS PI 6.5 STRL IVOR (GLOVE) ×15 IMPLANT
GOWN STRL REIN XL XLG (GOWN DISPOSABLE) ×21 IMPLANT
KIT DISP ACCESSORY 4 ARM (KITS) ×3 IMPLANT
NS IRRIG 1000ML POUR BTL (IV SOLUTION) ×9 IMPLANT
OCCLUDER COLPOPNEUMO (BALLOONS) ×3 IMPLANT
PACK LAVH (CUSTOM PROCEDURE TRAY) ×3 IMPLANT
PAD OB MATERNITY 4.3X12.25 (PERSONAL CARE ITEMS) ×3 IMPLANT
PAD PREP 24X48 CUFFED NSTRL (MISCELLANEOUS) ×6 IMPLANT
POSITIONER SURGICAL ARM (MISCELLANEOUS) ×6 IMPLANT
SCALPEL HARMONIC ACE (MISCELLANEOUS) IMPLANT
SET CYSTO W/LG BORE CLAMP LF (SET/KITS/TRAYS/PACK) IMPLANT
SET IRRIG TUBING LAPAROSCOPIC (IRRIGATION / IRRIGATOR) ×3 IMPLANT
SOLUTION ELECTROLUBE (MISCELLANEOUS) ×3 IMPLANT
SPONGE LAP 18X18 X RAY DECT (DISPOSABLE) ×6 IMPLANT
SUT MNCRL AB 3-0 PS2 27 (SUTURE) ×6 IMPLANT
SUT VIC AB 0 CT1 27 (SUTURE) ×6
SUT VIC AB 0 CT1 27XBRD ANBCTR (SUTURE) ×2 IMPLANT
SUT VIC AB 0 CT1 27XBRD ANTBC (SUTURE) ×10 IMPLANT
SUT VICRYL 0 UR6 27IN ABS (SUTURE) ×6 IMPLANT
SYR 50ML LL SCALE MARK (SYRINGE) ×3 IMPLANT
SYSTEM CONVERTIBLE TROCAR (TROCAR) ×3 IMPLANT
TIP RUMI ORANGE 6.7MMX12CM (TIP) ×3 IMPLANT
TOWEL OR 17X24 6PK STRL BLUE (TOWEL DISPOSABLE) ×9 IMPLANT
TROCAR 12M 150ML BLUNT (TROCAR) ×3 IMPLANT
TROCAR DISP BLADELESS 8 DVNC (TROCAR) ×2 IMPLANT
TROCAR DISP BLADELESS 8MM (TROCAR) ×1
TROCAR XCEL 12X100 BLDLESS (ENDOMECHANICALS) ×3 IMPLANT
TROCAR Z-THREAD BLADED 12X100M (TROCAR) IMPLANT
TUBING FILTER THERMOFLATOR (ELECTROSURGICAL) ×3 IMPLANT
WARMER LAPAROSCOPE (MISCELLANEOUS) ×3 IMPLANT
WATER STERILE IRR 1000ML POUR (IV SOLUTION) ×9 IMPLANT

## 2011-09-02 NOTE — Anesthesia Procedure Notes (Signed)
Procedure Name: Intubation Date/Time: 09/02/2011 7:36 AM Performed by: Karleen Dolphin Pre-anesthesia Checklist: Emergency Drugs available, Timeout performed, Suction available, Patient identified and Patient being monitored Patient Re-evaluated:Patient Re-evaluated prior to inductionOxygen Delivery Method: Circle System Utilized Preoxygenation: Pre-oxygenation with 100% oxygen Intubation Type: IV induction Ventilation: Oral airway inserted - appropriate to patient size and Mask ventilation without difficulty Laryngoscope Size: 3 (Glide scope blade size) Grade View: Grade I Tube type: Oral Tube size: 7.0 mm Number of attempts: 1 Airway Equipment and Method: patient positioned with wedge pillow,  stylet and video-laryngoscopy Placement Confirmation: ETT inserted through vocal cords under direct vision,  breath sounds checked- equal and bilateral and positive ETCO2 Secured at: 21 cm Tube secured with: Tape Dental Injury: Teeth and Oropharynx as per pre-operative assessment  Difficulty Due To: Difficulty was anticipated

## 2011-09-02 NOTE — Transfer of Care (Signed)
Immediate Anesthesia Transfer of Care Note  Patient: Chelsey [name]  Procedure(s) Performed:  ROBOTIC ASSISTED TOTAL HYSTERECTOMY  Patient Location: PACU  Anesthesia Type: General  Level of Consciousness: awake, alert  and oriented  Airway & Oxygen Therapy: Patient Spontanous Breathing and Patient connected to nasal cannula oxygen  Post-op Assessment: Report given to PACU RN and Post -op Vital signs reviewed and stable  Post vital signs: stable  Complications: No apparent anesthesia complications

## 2011-09-02 NOTE — Progress Notes (Signed)
Heating pad placed to right shoulder per Dr Rodman Pickle

## 2011-09-02 NOTE — Op Note (Signed)
Pre-operative Diagnosis:menorrhagia, pelvic pain, uterine fibroid  Post-operative Diagnosis: same  Surgeon: Surgeon(s) and Role:    * Hal Morales, MD - Primary   First Asst.: Henreitta Leber PA-C.  Procedure and Anesthesia:  Procedure(s) and Anesthesia Type:    * ROBOTIC ASSISTED TOTAL HYSTERECTOMY - General  ASA Class: 3  EBL: 225 cc  Urine: 600 cc  IV fluids: 1500 cc  Specimen to pathology: Uterus and cervix weighing 371 g  Findings: Uterus sounded to 12 cm. It was noted to be globular, and filled the pelvis. The tubes were status post tubal ligation for sterilization. The ovaries were within normal limits.  Procedure: The patient was identified in the preoperative area and the proposed surgery reviewed. She was taken to the operating room and placed on the operating table. After the attainment of adequate general anesthesia she was placed in the modified lithotomy position with appropriate safeguards for anticipated steep Trendelenburg positioning, and including appropriate wrapping of the extremities, beanbag placement for shoulder and upper extremity protection during surgery, eye goggles, facial padding.  Once all of the participants in the surgical procedure were in the operating room a timeout was held.  The abdomen was prepped with chlor prep and the perineum and vagina prepped with multiple layers of Betadine. The abdomen and perineum were then draped as sterile fields. A weighted speculum was placed in the posterior vagina and a single-tooth tenaculum placed on the anterior cervix. A suture of 2-0 Vicryl was placed in the cervix at the 10:00 position. The uterus was sounded to 12 cm. The 12 cm uterine manipulator was chosen and the [employer], uterine manipulator and the vaginal occluder placed in to the vaginal canal. The uterine manipulator was placed into the uterine cavity. A large Koh ring was chosen to fit over the entire cervix and into the vaginal fornices. A three-way  Foley catheter was placed in the bladder under sterile conditions and connected to straight drainage.  The surgeon changed gloves and the abdomen was prepared for port placement. The camera port was placed approximately 5 cm above the umbilicus in the midline.  2  8 mm ports, were outlined to the left of midline, and an 8 mm and 12 mm port were planned for the right of midline. These were configured in an arcuate fashion.  Each of the proposed ports sites was infiltrated with 0.25% Marcaine.  Tthe camera site was incised and that incision taken down to the fascia. The fascia was elevated, incised and then marked with sutures of 0 Vicryl. The peritoneum was entered bluntly and a Hasson cannula placed into the peritoneal cavity then secured with the 0 Vicryl sutures. The remainder of the trochars were placed under direct visualization as listed above. The patient was placed in steep Trendelenburg position.  The robot was docked with 3 operative arms, the camera port and the insufflator attached to the assistant port.   Monopolar scissors were placed through the first operative arm, a P K. Bipolar dissector was placed through the second operative arm and a Prograsp instrument placed through the third operative port. The surgeon then went to the console to begin the operative procedure.  The ureters on either side were identified. The right round ligament was cauterized and incised as was the utero-ovarian ligament on the right side. The posterior leaf of the broad ligament was incised down to the level of the Uterine artery on the right side. The anterior leaflet of the broad ligament on the  right side was then incised down to the same level skeletonizing along the way. The upper pedicle on the left side were treated in a similar fashion with bipolar cautery and monopolar cutting thus freeing up the ovary and tube on that side.  The anterior and posterior leaves of the left broad ligament were incised down to the  level of the uterine artery and the intervening tissue skeletonized. The bladder flap was created by incising the anterior leaves of the broad ligament across the anterior cul-de-sac. The bladder was dissected off the anterior or vagina with a combination of blunt dissection and monopolar cautery.  The dissection was aided by instilling 200cc of sterile water through the instillation port of the foley catheter. The left uterine vessels were then futher skeletonized with a combination of bipolar and monopolar energy. A similar procedure was carried out on the opposite side. During this time, dissection was achieved with elevation of the uterus and identification of the [facility] ring circumferentially anterior and posterior at the vaginal fornix. The Prograsp was used sequentially to assist in placement of the bulky uterus for maximum exposure of the uterine vasculature. Once the uterine vessels on the left side were satisfactorily identified and skeletonized a similar procedure was carried out on the right side, after changing the site of the scissor and PK instruments to maximize the opportunity for grasping and cauterizing the vessels. Once adequate bipolar cautery of the uterine vessels was assured these vessels were cut and the colpotomy begun posteriorly until the Koh ring could be visualized. The colpotomy was continued on around the right vaginal angle and anteriorly. The left uterine vessels were then identified as appropriately skeletonized, then cauterized with bipolar cautery and cut. The posterior colpotomy was then extended toward the left as was the anterior colpotomy. Bleeding sites from the left anterior and posterior vaginal cuff were addressed with bipolar cautery with the assurance that the ureter had been visualized and was out of the field of cautery. The colpotomy was completed circumferentially and the uterus and cervix removed through the vagina.  The vaginal cuff was then closed with  figure-of-eight sutures of 0 Vicryl beginning at each vaginal angle and making certain to include vaginal mucosa in each suture,  Once closure was complete, hemostasis was noted to be adequate in the vaginal cuff region. The remainder of the pelvis was then inspected, including each of the utero ovarian ligament sites and round ligaments. The utero-ovarian ligament site on the left side was  cauterized to achieve excellent hemostasis. Irrigation was carried out and a portion of the pneumoperitoneum allowed to release to exclude any tamponade effect on appearance of hemostasis. Hemostasis was again confirmed.  The surgeon re scrubbed, gowned and gloved to assist in undocking the robot.  The patient was removed from the steep Trendelenburg position and the upper abdomen visualized to ensure that no excess blood remained in the upper abdomen.  All trocar sites were then removed under direct visualization. The supraumbilical port was closed with figure-of-eight sutures of 0 Vicryl, a subcutaneous  suture of 3-0 Monocryl and a subcuticular suture of 3-0 Monocryl. The incision was covered with Dermabond. The right lower quadrant 12 mm port was closed in a similar fashion. The remaining 3  8 millimeter ports were closed with Dermabond. The vagina was then visualized with a Graves speculum and noted to be hemostatic. The patient was awakened from general anesthesia and taken to the recovery room in satisfactory condition having tolerated the procedure well with  sponge and instrument counts correct.   During the entirety of the procedure the surgeon and anesthesia personnel were in communication with reports that the patient was stable. In spite of the prolonged duration of the procedure, the decision was made to continue with robotic hysterectomy in light of the stability of the patient, and her desire for a minimally invasive procedure. Obstacles to more rapid completion of the  procedure were addressed as they arose  and at no time during the procedure was it considered a safety concern to continue the minimally invasive procedure as planned.

## 2011-09-02 NOTE — Brief Op Note (Cosign Needed)
09/02/2011  3:50 PM  PATIENT:  Joetta Manners  46 y.o. female  PRE-OPERATIVE DIAGNOSIS:  menorrhagia, pelvic pain  POST-OPERATIVE DIAGNOSIS:  menorrhagia, pelvic pain    Procedure(s): ROBOTIC ASSISTED TOTAL HYSTERECTOMY     Surgeon(s): Hal Morales, MD   ASSISTANTS: Henreitta Leber PA-C  ANESTHESIA:   local and general  LOCAL MEDICATIONS USED:  MARCAINE 20CC  SPECIMEN:  Uterus and Cervix = 371 grams  DISPOSITION OF SPECIMEN:  PATHOLOGY  COUNTS:  YES  ESTIMATED BLOOD LOSS: 225 cc Urine output: 600 cc IV Fluids: 1500 cc  PATIENT DISPOSITION:  PACU - hemodynamically stable.   DICTATION #Henreitta Leber, PA-C 09/02/2011 3:50 PM

## 2011-09-02 NOTE — Anesthesia Postprocedure Evaluation (Signed)
Anesthesia Post Note  Patient: Chelsey [name]  Procedure(s) Performed:  ROBOTIC ASSISTED TOTAL HYSTERECTOMY  Anesthesia type: General  Patient location: PACU  Post pain: Pain level controlled  Post assessment: Post-op Vital signs reviewed  Last Vitals:  Filed Vitals:   09/02/11 1700  BP: 127/57  Pulse: 85  Temp: 36.9 C  Resp: 17    Post vital signs: Reviewed  Level of consciousness: sedated  Complications: No apparent anesthesia complications

## 2011-09-02 NOTE — H&P (Signed)
I have examined the patient and reviewed the proposed procedure.  I have examined her right arm which this am has no edema or weakness.  The risks, benefits and alternatives of management of her hysterectomy have been explained and her questions answered. She wishes to proceed with robotically assisted total hysterectomy with the possibility of abdominal hysterectomy.

## 2011-09-03 DIAGNOSIS — F3131 Bipolar disorder, current episode depressed, mild: Secondary | ICD-10-CM | POA: Clinically undetermined

## 2011-09-03 LAB — CBC
MCHC: 31.3 g/dL (ref 30.0–36.0)
Platelets: 156 10*3/uL (ref 150–400)
RDW: 14.1 % (ref 11.5–15.5)
WBC: 11.3 10*3/uL — ABNORMAL HIGH (ref 4.0–10.5)

## 2011-09-03 MED ORDER — DSS 100 MG PO CAPS: 100.0000 mg | ORAL_CAPSULE | Freq: Two times a day (BID) | ORAL | Status: AC | PRN

## 2011-09-03 NOTE — Progress Notes (Signed)
Pt d/c home, ambulatory with family in private car. D/c instructions and prescriptions reviewed with pt. Pt verbalized understanding.

## 2011-09-03 NOTE — Discharge Summary (Addendum)
Physician Discharge Summary  Patient ID: Malissie Musgrave MRN: [id number] DOB/AGE: [birth year] 46 y.o.  Admit date: 09/02/2011 Discharge date: 09/03/2011  Admission Diagnoses:Menorrhagia, Pelvic Pain, Fibroids  Discharge Diagnoses:  Active Problems:  Menorrhagia  Pelvic pain  Fibroid  Obesity, Class III, BMI 40-49.9 (morbid obesity)  Bipolar 1 disorder, depressed, mild   Discharged Condition: improved and stable  Hospital Course: the patient underwent a robotically assisted total hysterectomy with postoperative extended stay.  The details are outlined in the operative report.  Postoperatively she did well with rapid return of GI and GU function.  Postoperative hgb was consistent with the recorded blood loss of 225cc.  The patient was hemodynamically stable.  She required no pain medicine overnight beyond her scheduled Toradol. She did complain of right shoulder pain on awakening in the PACU, but that has resolved overnight with local heat.  Today she has full range of motion on that side and no weakness to exam.  She is deemed to have received maximum hospital benefit and be ready for discharge home.  Consults: None  Significant Diagnostic Studies: labs: CBC  Treatments: IV hydration, analgesia: Toradol and surgery:   Discharge Exam: Blood pressure 113/72, pulse 97, temperature 98.3 F (36.8 C), temperature source Oral, resp. rate 20, height 5\' 6"  (1.676 m), weight 276 lb (125.193 kg), SpO2 98.00%. General appearance: alert, cooperative, no distress and morbidly obese Resp: clear to auscultation bilaterally Chest wall: no tenderness GI: soft, non-tender; bowel sounds normal; no masses,  no organomegaly Extremities: extremities normal, atraumatic, no cyanosis or edema and Homans sign is negative, no sign of DVT Incision/Wound:5 port sites healing well  Disposition: Home or Self Care   Medication List  As of 09/03/2011 10:25 AM   TAKE these medications         Biotin 1000 MCG tablet     Take 1,000 mcg by mouth daily.      buPROPion 150 MG 24 hr tablet   Commonly known as: WELLBUTRIN XL   Take 150 mg by mouth daily.      CALCIUM CITRATE-VITAMIN D PO   Take 1 tablet by mouth daily. Calcium citrate 400 mg, vitamin d3 500 IU      CALCIUM GLUCONATE PO   Take 1 tablet by mouth daily.      docusate sodium 100 MG capsule   Commonly known as: COLACE   Take 1 capsule (100 mg total) by mouth 3 (three) times daily as needed for constipation.      DSS 100 MG Caps   Take 100 mg by mouth 2 (two) times daily as needed.      hydrochlorothiazide 25 MG tablet   Commonly known as: HYDRODIURIL   Take 25 mg by mouth daily.      ibuprofen 600 MG tablet   Commonly known as: ADVIL,MOTRIN   Take 1 tablet (600 mg total) by mouth every 6 (six) hours. for 5 days then as needed. Take with food.      lithium carbonate 300 MG capsule   Take 600 mg by mouth at bedtime.      lithium carbonate 300 MG capsule   Take 300 mg by mouth daily.      methylPREDNISolone 4 MG tablet   Commonly known as: MEDROL DOSEPAK   Take 4 mg by mouth. follow package directions      multivitamins ther. w/minerals Tabs   Take 1 tablet by mouth daily.      oxyCODONE-acetaminophen 5-325 MG per tablet   Commonly known  as: PERCOCET   Take 1-2 tablets by mouth every 4 (four) hours as needed for pain. For pain. Usually takes one and goes to sleep      promethazine 12.5 MG tablet   Commonly known as: PHENERGAN   Take 1 tablet (12.5 mg total) by mouth every 6 (six) hours as needed for nausea.      traMADol 50 MG tablet   Commonly known as: ULTRAM   Take 50 mg by mouth every 4 (four) hours as needed. For migraine           Follow-up Information    Follow up with Hal Morales, MD on 09/13/2011. (Your appointment time is at 2:45 p.m.)    Contact information:   [address] [place] [address] [phone]          Signed: Hal Morales 09/03/2011, 10:25  AM

## 2011-09-12 NOTE — Addendum Note (Signed)
Addendum  created 09/12/11 1424 by Karleen Dolphin, CRNA   Modules edited:Charges VN

## 2011-09-12 NOTE — Addendum Note (Signed)
Addendum  created 09/12/11 1424 by Carlyle Achenbach, CRNA   Modules edited:Charges VN    

## 2011-09-27 ENCOUNTER — Encounter (INDEPENDENT_AMBULATORY_CARE_PROVIDER_SITE_OTHER): Payer: Managed Care, Other (non HMO) | Admitting: Obstetrics and Gynecology

## 2011-09-27 DIAGNOSIS — N949 Unspecified condition associated with female genital organs and menstrual cycle: Secondary | ICD-10-CM

## 2011-09-27 DIAGNOSIS — N920 Excessive and frequent menstruation with regular cycle: Secondary | ICD-10-CM

## 2011-10-04 ENCOUNTER — Encounter: Payer: Self-pay | Admitting: Obstetrics and Gynecology

## 2011-10-20 ENCOUNTER — Encounter (INDEPENDENT_AMBULATORY_CARE_PROVIDER_SITE_OTHER): Payer: Managed Care, Other (non HMO) | Admitting: Obstetrics and Gynecology

## 2011-10-20 DIAGNOSIS — N949 Unspecified condition associated with female genital organs and menstrual cycle: Secondary | ICD-10-CM

## 2011-10-20 DIAGNOSIS — N920 Excessive and frequent menstruation with regular cycle: Secondary | ICD-10-CM

## 2011-11-14 ENCOUNTER — Encounter (HOSPITAL_COMMUNITY): Payer: Self-pay

## 2011-11-14 ENCOUNTER — Inpatient Hospital Stay (HOSPITAL_COMMUNITY): Admit: 2011-11-14 | Payer: Self-pay | Admitting: Obstetrics and Gynecology

## 2011-11-14 SURGERY — Surgical Case
Anesthesia: Regional

## 2011-11-21 ENCOUNTER — Encounter: Payer: Self-pay | Admitting: Obstetrics and Gynecology

## 2011-11-21 ENCOUNTER — Ambulatory Visit (INDEPENDENT_AMBULATORY_CARE_PROVIDER_SITE_OTHER): Payer: Managed Care, Other (non HMO) | Admitting: Obstetrics and Gynecology

## 2011-11-21 VITALS — BP 120/72 | HR 80 | Temp 98.5°F | Ht 67.0 in | Wt 285.0 lb

## 2011-11-21 DIAGNOSIS — Z9889 Other specified postprocedural states: Secondary | ICD-10-CM

## 2011-11-21 NOTE — Progress Notes (Signed)
  Subjective:     Chelsey [name] is a 46 y.o. female who presents 10 weeks status post Robotic assisted total vaginal hysterectomy for abnormal uterine bleeding and fibroids.  The following portions of the patient's history were reviewed and updated as appropriate: allergies, current medications, past family history, past medical history, past social history, past surgical history and problem list.  Review of Systems Pertinent items are noted in HPI.   Objective:    BP 120/72  Pulse 80  Temp 98.5 F (36.9 C)  Ht 5\' 7"  (1.702 m)  Wt 285 lb (129.275 kg)  BMI 44.64 kg/m2 Weight:  Wt Readings from Last 1 Encounters:  11/21/11 285 lb (129.275 kg)    BMI: Body mass index is 44.64 kg/(m^2).  General Appearance: Alert, appropriate appearance for age. No acute distress Lungs: clear to auscultation bilaterally Back: No CVA tenderness Cardiovascular: Regular rate and rhythm. S1, S2, no murmur Gastrointestinal: Soft, non-tender, no masses or organomegaly Incision/s: healing well Pelvic Exam: vaginal cuff healing well   Bimanual exam normal  Assessment:    Doing well postoperatively. Operative findings again reviewed. Pathology report discussed.    Plan:     1. Continue any current medications. 3. Activity restrictions: none 4. Anticipated return to work: already at work, no restrictions. 5. Follow up: 1 yr for annual   Farooq Petrovich P MD 4/22/20136:57 PM

## 2011-11-21 NOTE — Patient Instructions (Signed)
Calorie Counting Diet A calorie counting diet requires you to eat the number of calories that are right for you in a day. Calories are the measurement of how much energy you get from the food you eat. Eating the right amount of calories is important for staying at a healthy weight. If you eat too many calories, your body will store them as fat and you may gain weight. If you eat too few calories, you may lose weight. Counting the number of calories you eat during a day will help you know if you are eating the right amount. A Registered Dietitian can determine how many calories you need in a day. The amount of calories needed varies from person to person. If your goal is to lose weight, you will need to eat fewer calories. Losing weight can benefit you if you are overweight or have health problems such as heart disease, high blood pressure, or diabetes. If your goal is to gain weight, you will need to eat more calories. Gaining weight may be necessary if you have a certain health problem that causes your body to need more energy. TIPS Whether you are increasing or decreasing the number of calories you eat during a day, it may be hard to get used to changes in what you eat and drink. The following are tips to help you keep track of the number of calories you eat.  Measure foods at home with measuring cups. This helps you know the amount of food and number of calories you are eating.   Restaurants often serve food in amounts that are larger than 1 serving. While eating out, estimate how many servings of a food you are given. For example, a serving of cooked rice is  cup or about the size of half of a fist. Knowing serving sizes will help you be aware of how much food you are eating at restaurants.   Ask for smaller portion sizes or child-size portions at restaurants.   Plan to eat half of a meal at a restaurant. Take the rest home or share the other half with a friend.   Read the Nutrition Facts panel on  food labels for calorie content and serving size. You can find out how many servings are in a package, the size of a serving, and the number of calories each serving has.   For example, a package might contain 3 cookies. The Nutrition Facts panel on that package says that 1 serving is 1 cookie. Below that, it will say there are 3 servings in the container. The calories section of the Nutrition Facts label says there are 90 calories. This means there are 90 calories in 1 cookie (1 serving). If you eat 1 cookie you have eaten 90 calories. If you eat all 3 cookies, you have eaten 270 calories (3 servings x 90 calories = 270 calories).  The list below tells you how big or small some common portion sizes are.  1 oz.........4 stacked dice.   3 oz.........Deck of cards.   1 tsp........Tip of little finger.   1 tbs........Thumb.   2 tbs........Golf ball.    cup.......Half of a fist.   1 cup........A fist.  KEEP A FOOD LOG Write down every food item you eat, the amount you eat, and the number of calories in each food you eat during the day. At the end of the day, you can add up the total number of calories you have eaten. It may help to keep a   list like the one below. Find out the calorie information by reading the Nutrition Facts panel on food labels. Breakfast  Bran cereal (1 cup, 110 calories).   Fat-free milk ( cup, 45 calories).  Snack  Apple (1 medium, 80 calories).  Lunch  Spinach (1 cup, 20 calories).   Tomato ( medium, 20 calories).   Chicken breast strips (3 oz, 165 calories).   Shredded cheddar cheese ( cup, 110 calories).   Light Italian dressing (2 tbs, 60 calories).   Whole-wheat bread (1 slice, 80 calories).   Tub margarine (1 tsp, 35 calories).   Vegetable soup (1 cup, 160 calories).  Dinner  Pork chop (3 oz, 190 calories).   Brown rice (1 cup, 215 calories).   Steamed broccoli ( cup, 20 calories).   Strawberries (1  cup, 65 calories).   Whipped  cream (1 tbs, 50 calories).  Daily Calorie Total: 1425 Document Released: 07/18/2005 Document Revised: 07/07/2011 Document Reviewed: 01/12/2007 [employer] Patient Information 2012 [employer].  Exercise to Lose Weight Exercise and a healthy diet may help you lose weight. Your doctor may suggest specific exercises. EXERCISE IDEAS AND TIPS  Choose low-cost things you enjoy doing, such as walking, bicycling, or exercising to workout videos.   Take stairs instead of the elevator.   Walk during your lunch break.   Park your car further away from work or school.   Go to a gym or an exercise class.   Start with 5 to 10 minutes of exercise each day. Build up to 30 minutes of exercise 4 to 6 days a week.   Wear shoes with good support and comfortable clothes.   Stretch before and after working out.   Work out until you breathe harder and your heart beats faster.   Drink extra water when you exercise.   Do not do so much that you hurt yourself, feel dizzy, or get very short of breath.  Exercises that burn about 150 calories:  Running 1  miles in 15 minutes.   Playing volleyball for 45 to 60 minutes.   Washing and waxing a car for 45 to 60 minutes.   Playing touch football for 45 minutes.   Walking 1  miles in 35 minutes.   Pushing a stroller 1  miles in 30 minutes.   Playing basketball for 30 minutes.   Raking leaves for 30 minutes.   Bicycling 5 miles in 30 minutes.   Walking 2 miles in 30 minutes.   Dancing for 30 minutes.   Shoveling snow for 15 minutes.   Swimming laps for 20 minutes.   Walking up stairs for 15 minutes.   Bicycling 4 miles in 15 minutes.   Gardening for 30 to 45 minutes.   Jumping rope for 15 minutes.   Washing windows or floors for 45 to 60 minutes.  Document Released: 08/20/2010 Document Revised: 07/07/2011 Document Reviewed: 08/20/2010 [employer] Patient Information 2012 [employer]. 

## 2012-05-02 ENCOUNTER — Other Ambulatory Visit (HOSPITAL_BASED_OUTPATIENT_CLINIC_OR_DEPARTMENT_OTHER): Payer: Self-pay | Admitting: Family Medicine

## 2012-05-02 ENCOUNTER — Other Ambulatory Visit: Payer: Self-pay | Admitting: Family Medicine

## 2012-05-02 DIAGNOSIS — Z1231 Encounter for screening mammogram for malignant neoplasm of breast: Secondary | ICD-10-CM

## 2012-05-03 ENCOUNTER — Ambulatory Visit (HOSPITAL_BASED_OUTPATIENT_CLINIC_OR_DEPARTMENT_OTHER)
Admission: RE | Admit: 2012-05-03 | Discharge: 2012-05-03 | Disposition: A | Discharge status: Home / Self Care | Payer: Managed Care, Other (non HMO) | Source: Ambulatory Visit | Attending: Family Medicine | Admitting: Family Medicine

## 2012-05-03 DIAGNOSIS — Z1231 Encounter for screening mammogram for malignant neoplasm of breast: Secondary | ICD-10-CM

## 2014-01-19 ENCOUNTER — Ambulatory Visit (INDEPENDENT_AMBULATORY_CARE_PROVIDER_SITE_OTHER): Payer: Managed Care, Other (non HMO) | Admitting: Emergency Medicine

## 2014-01-19 VITALS — BP 124/86 | HR 70 | Temp 98.3°F | Resp 18 | Ht 67.0 in | Wt 302.0 lb

## 2014-01-19 DIAGNOSIS — L02219 Cutaneous abscess of trunk, unspecified: Secondary | ICD-10-CM

## 2014-01-19 DIAGNOSIS — L723 Sebaceous cyst: Secondary | ICD-10-CM

## 2014-01-19 DIAGNOSIS — L089 Local infection of the skin and subcutaneous tissue, unspecified: Secondary | ICD-10-CM

## 2014-01-19 DIAGNOSIS — L03319 Cellulitis of trunk, unspecified: Principal | ICD-10-CM

## 2014-01-19 MED ORDER — SULFAMETHOXAZOLE-TMP DS 800-160 MG PO TABS: 1.0000 | ORAL_TABLET | Freq: Two times a day (BID) | ORAL | Status: DC

## 2014-01-19 MED ORDER — ACETAMINOPHEN-CODEINE #3 300-30 MG PO TABS
1.0000 | ORAL_TABLET | ORAL | Status: DC | PRN
Start: 2014-01-19 — End: 2014-01-21

## 2014-01-19 NOTE — Progress Notes (Signed)
[facility] [address], [place] [address] [phone]  Date:  01/19/2014   Name:  Chelsey [name]   DOB:  1965/09/05   MRN:  [id number]  PCP:  Elyn Peers, MD    Chief Complaint: Recurrent Skin Infections   History of Present Illness:  Chelsey [name] is a 48 y.o. very pleasant female patient who presents with the following:  1 week history of pain in left posterior shoulder interferes with sleep.  No fever or chills.  No history of injury.  No prior abscesses.  No improvement with over the counter medications or other home remedies. Denies other complaint or health concern today.   Patient Active Problem List   Diagnosis Date Noted  . Obesity, Class III, BMI 40-49.9 (morbid obesity) 09/03/2011  . Bipolar 1 disorder, depressed, mild 09/03/2011    Past Medical History  Diagnosis Date  . Hypertension   . Anemia   . Depression   . Anxiety   . EHOZYYQM(250.0)     Past Surgical History  Procedure Laterality Date  . Gastric bypass  2002  . Cholecystectomy    . Axillary surgery  1997    removal of "sweat glands"    History  Substance Use Topics  . Smoking status: Never Smoker   . Smokeless tobacco: Never Used  . Alcohol Use: Yes     Comment: occasionally    No family history on file.  Allergies  Allergen Reactions  . Latex     Per patient it is the powder    Medication list has been reviewed and updated.  Current Outpatient Prescriptions on File Prior to Visit  Medication Sig Dispense Refill  . Biotin 1000 MCG tablet Take 1,000 mcg by mouth daily.      Mar[place] Kitchen buPROPion (WELLBUTRIN XL) 150 MG 24 hr tablet Take 150 mg by mouth daily.      Mar[place] Kitchen CALCIUM CITRATE-VITAMIN D PO Take 1 tablet by mouth daily. Calcium citrate 400 mg, vitamin d3 500 IU      . CALCIUM GLUCONATE PO Take 1 tablet by mouth daily.      Marland Kitchen estradiol (ESTRACE) 0.1 MG/GM vaginal cream Place 1 g vaginally 2 (two) times a week.      . hydrochlorothiazide (HYDRODIURIL) 25 MG tablet  Take 25 mg by mouth daily.      Marland Kitchen lithium carbonate 300 MG capsule Take 600 mg by mouth at bedtime.      Marland Kitchen lithium carbonate 300 MG capsule Take 300 mg by mouth daily.      . methylPREDNISolone (MEDROL DOSEPAK) 4 MG tablet Take 4 mg by mouth. follow package directions      . Multiple Vitamins-Minerals (MULTIVITAMINS THER. W/MINERALS) TABS Take 1 tablet by mouth daily.      Marland Kitchen oxyCODONE-acetaminophen (PERCOCET) 5-325 MG per tablet Take 1-2 tablets by mouth every 4 (four) hours as needed for pain. For pain. Usually takes one and goes to sleep  30 tablet  0  . traMADol (ULTRAM) 50 MG tablet Take 50 mg by mouth every 4 (four) hours as needed. For migraine       No current facility-administered medications on file prior to visit.    Review of Systems:  As per HPI, otherwise negative.    Physical Examination: Filed Vitals:   01/19/14 1046  BP: 124/86  Pulse: 70  Temp: 98.3 F (36.8 C)  Resp: 18   Filed Vitals:   01/19/14 1046  Height: 5\' 7"  (1.702 m)  Weight: 302 lb (136.986 kg)   Body mass index is 47.29 kg/(m^2). Ideal Body Weight: Weight in (lb) to have BMI = 25: 159.3   GEN: WDWN, NAD, Non-toxic, Alert & Oriented x 3 HEENT: Atraumatic, Normocephalic.  Ears and Nose: No external deformity. EXTR: No clubbing/cyanosis/edema NEURO: Normal gait.  PSYCH: Normally interactive. Conversant. Not depressed or anxious appearing.  Calm demeanor.  Left shoulder:  Abscess.  Assessment and Plan: Abscess shoulder Septra I&D.   Discussed options with patient and she wishes surgical treatment.  Understands dressing, scarring and drainage.   Signed,  Ellison Carwin, MD

## 2014-01-19 NOTE — Patient Instructions (Signed)

## 2014-01-19 NOTE — Progress Notes (Signed)
Procedure: Consent obtained - Local anesthesia with 2% lido with epi - betadine prep - #11 blade used to make a 1cm incision - purulence and sebaceous material with capsule removed.  Packed with 1/4in plain packing - drsg placed -  Wound care d/w pt recheck in 48h

## 2014-01-21 ENCOUNTER — Ambulatory Visit (INDEPENDENT_AMBULATORY_CARE_PROVIDER_SITE_OTHER): Payer: Managed Care, Other (non HMO) | Admitting: Physician Assistant

## 2014-01-21 VITALS — BP 118/82 | HR 60 | Temp 98.7°F | Resp 16 | Ht 67.0 in | Wt 299.8 lb

## 2014-01-21 DIAGNOSIS — L723 Sebaceous cyst: Secondary | ICD-10-CM

## 2014-01-21 DIAGNOSIS — L089 Local infection of the skin and subcutaneous tissue, unspecified: Secondary | ICD-10-CM

## 2014-01-21 LAB — WOUND CULTURE: GRAM STAIN: NONE SEEN

## 2014-01-21 NOTE — Progress Notes (Signed)
   Subjective:    Patient ID: Chelsey [name], female    DOB: 10/10/1965, 48 y.o.   MRN: [id number]  HPI Pt presents to clinic for a wound recheck of her infected sebaceous cyst.  The area is less painful and her neck pain has resolved.  She has been changing the drsg daily and there is no purulence from the wound.  She is tolerating the abx other than some slight nausea.  She did have chills last night and then some today but no documented fever and she otherwise feels fine.  Review of Systems     Objective:   Physical Exam  Vitals reviewed. Constitutional: She is oriented to person, place, and time. She appears well-developed and well-nourished.  HENT:  Head: Normocephalic and atraumatic.  Right Ear: External ear normal.  Left Ear: External ear normal.  Pulmonary/Chest: Effort normal.  Neurological: She is alert and oriented to person, place, and time.  Skin: Skin is warm and dry.  Packing removed - no sebaceous material or purulence expressed - good granulation tissue seen area cleaned with 1% lido and repacked with 1/4in plain packing drsg placed.  Psychiatric: She has a normal mood and affect. Her behavior is normal. Judgment and thought content normal.       Assessment & Plan:  Infected sebaceous cyst - continue abx.  Pt to monitor her temp with her chills and recheck in 3 days (sooner if her chills worsen or she develops fever) - she should continue daily drsg changes -   Windell Hummingbird PA-C  [facility] 01/21/2014 7:50 PM

## 2014-01-25 ENCOUNTER — Ambulatory Visit (INDEPENDENT_AMBULATORY_CARE_PROVIDER_SITE_OTHER): Payer: Managed Care, Other (non HMO) | Admitting: Physician Assistant

## 2014-01-25 VITALS — BP 114/76 | HR 79 | Temp 98.6°F | Resp 18 | Ht 66.5 in | Wt 298.0 lb

## 2014-01-25 DIAGNOSIS — L723 Sebaceous cyst: Secondary | ICD-10-CM

## 2014-01-25 DIAGNOSIS — L089 Local infection of the skin and subcutaneous tissue, unspecified: Secondary | ICD-10-CM

## 2014-01-25 NOTE — Progress Notes (Signed)
   Subjective:    Patient ID: Chelsey [name], female    DOB: 1966-02-26, 48 y.o.   MRN: [id number]  Wound Check     Chelsey [name] is a very pleasant 48 yr old female here for wound care following I&D of an infected sebaceous cyst on 01/21/14.  Pt states she would like the packing removed today.  She does not understand why she has had to come back so many times (this is  2nd follow up visit.)  She denies pain.  Minimal drainage.  She is changing the dressing daily.  Taking the abx as directed.      Review of Systems  Constitutional: Negative for fever and chills.  Respiratory: Negative.   Cardiovascular: Negative.   Gastrointestinal: Negative.   Skin: Positive for wound.       Objective:   Physical Exam  Vitals reviewed. Constitutional: She is oriented to person, place, and time. She appears well-developed and well-nourished. No distress.  HENT:  Head: Normocephalic and atraumatic.  Pulmonary/Chest: Effort normal.  Neurological: She is alert and oriented to person, place, and time.  Skin: Skin is warm and dry.     Healing wound LEFT shoulder; dressing and packing removed; unable to express any drainage; not repacked today  Psychiatric: She has a normal mood and affect. Her behavior is normal.        Assessment & Plan:  Infected sebaceous cyst  Chelsey [name] is a very pleasant 48 yr old female here for wound care. Wound is healing very well.  Not repacked.  Continue daily dressing changes until completely healed.  Finish abx.  RTC if concerns.  Pt to call or RTC if worsening or not improving  E. Natividad Brood MHS, PA-C [facility] 6/27/20151:04 PM

## 2014-03-24 ENCOUNTER — Ambulatory Visit (INDEPENDENT_AMBULATORY_CARE_PROVIDER_SITE_OTHER): Payer: Managed Care, Other (non HMO)

## 2014-03-24 ENCOUNTER — Ambulatory Visit (INDEPENDENT_AMBULATORY_CARE_PROVIDER_SITE_OTHER): Payer: Managed Care, Other (non HMO) | Admitting: Family Medicine

## 2014-03-24 VITALS — BP 130/90 | HR 69 | Temp 98.2°F | Resp 16 | Ht 67.0 in | Wt 303.0 lb

## 2014-03-24 DIAGNOSIS — M25572 Pain in left ankle and joints of left foot: Secondary | ICD-10-CM

## 2014-03-24 DIAGNOSIS — M25579 Pain in unspecified ankle and joints of unspecified foot: Secondary | ICD-10-CM

## 2014-03-24 DIAGNOSIS — S92912A Unspecified fracture of left toe(s), initial encounter for closed fracture: Secondary | ICD-10-CM

## 2014-03-24 DIAGNOSIS — S92919A Unspecified fracture of unspecified toe(s), initial encounter for closed fracture: Secondary | ICD-10-CM

## 2014-03-24 NOTE — Progress Notes (Signed)
Subjective:  48 year old lady who stepped on the leg of a 2 weeks ago. She hurt her left foot. It swelled off and on, responded to elevation, but has continued to hurt. It really didn't bruise much.  Objective: Healthy but overweight lady in no major acute distress. When she stands on her foot it hurts, primarily at the third and fourth MTP area. These are quite tender. The arch is nontender.  Assessment: Pain left foot  Plan: X-ray  [facility] reading (PRIMARY) by  Dr. Linna Darner Fx prox phalynx left 4th toe, spiral   She are ready has an appointment with a podiatrist in 2 days, which I urge her to keep rather than making a referral to an orthopedist today.  Buddy tape the toes Keep appointment with podiatrist

## 2014-03-24 NOTE — Patient Instructions (Signed)
Try and elevate foot as much as possible  Ibuprofen for pain  Keep your appointment with the podiatrist Wednesday.  Return as needed

## 2015-06-09 ENCOUNTER — Other Ambulatory Visit (HOSPITAL_BASED_OUTPATIENT_CLINIC_OR_DEPARTMENT_OTHER): Payer: Self-pay | Admitting: Family Medicine

## 2015-06-09 ENCOUNTER — Ambulatory Visit (HOSPITAL_BASED_OUTPATIENT_CLINIC_OR_DEPARTMENT_OTHER)
Admission: RE | Admit: 2015-06-09 | Discharge: 2015-06-09 | Disposition: A | Discharge status: Home / Self Care | Payer: Managed Care, Other (non HMO) | Source: Ambulatory Visit | Attending: Family Medicine | Admitting: Family Medicine

## 2015-06-09 DIAGNOSIS — Z1231 Encounter for screening mammogram for malignant neoplasm of breast: Secondary | ICD-10-CM

## 2016-05-16 ENCOUNTER — Other Ambulatory Visit (HOSPITAL_COMMUNITY): Payer: Self-pay | Admitting: General Surgery

## 2016-05-24 ENCOUNTER — Ambulatory Visit (HOSPITAL_COMMUNITY): Payer: Managed Care, Other (non HMO)

## 2016-05-27 ENCOUNTER — Ambulatory Visit (HOSPITAL_COMMUNITY)
Admission: RE | Admit: 2016-05-27 | Discharge: 2016-05-27 | Disposition: A | Discharge status: Home / Self Care | Payer: Managed Care, Other (non HMO) | Source: Ambulatory Visit | Attending: General Surgery | Admitting: General Surgery

## 2016-05-27 DIAGNOSIS — Z9884 Bariatric surgery status: Secondary | ICD-10-CM | POA: Diagnosis not present

## 2017-01-07 ENCOUNTER — Emergency Department (HOSPITAL_COMMUNITY)
Admission: EM | Admit: 2017-01-07 | Discharge: 2017-01-07 | Disposition: A | Discharge status: Home / Self Care | Payer: 59 | Attending: Emergency Medicine | Admitting: Emergency Medicine

## 2017-01-07 ENCOUNTER — Encounter (HOSPITAL_COMMUNITY): Payer: Self-pay

## 2017-01-07 DIAGNOSIS — R1030 Lower abdominal pain, unspecified: Secondary | ICD-10-CM | POA: Diagnosis present

## 2017-01-07 DIAGNOSIS — A084 Viral intestinal infection, unspecified: Secondary | ICD-10-CM | POA: Insufficient documentation

## 2017-01-07 DIAGNOSIS — Z79899 Other long term (current) drug therapy: Secondary | ICD-10-CM | POA: Insufficient documentation

## 2017-01-07 DIAGNOSIS — I10 Essential (primary) hypertension: Secondary | ICD-10-CM | POA: Insufficient documentation

## 2017-01-07 LAB — COMPREHENSIVE METABOLIC PANEL
ALBUMIN: 4.2 g/dL (ref 3.5–5.0)
ALK PHOS: 105 U/L (ref 38–126)
ALT: 22 U/L (ref 14–54)
AST: 28 U/L (ref 15–41)
Anion gap: 9 (ref 5–15)
BUN: 13 mg/dL (ref 6–20)
CALCIUM: 8.8 mg/dL — AB (ref 8.9–10.3)
CO2: 20 mmol/L — AB (ref 22–32)
Chloride: 109 mmol/L (ref 101–111)
Creatinine, Ser: 0.74 mg/dL (ref 0.44–1.00)
GFR calc Af Amer: >60 mL/min (ref >60)
GFR calc non Af Amer: >60 mL/min (ref >60)
Glucose, Bld: 116 mg/dL — ABNORMAL HIGH (ref 65–99)
Potassium: 4 mmol/L (ref 3.5–5.1)
SODIUM: 138 mmol/L (ref 135–145)
Total Bilirubin: 0.8 mg/dL (ref 0.3–1.2)
Total Protein: 7.9 g/dL (ref 6.5–8.1)

## 2017-01-07 LAB — CBC
HCT: 38.6 % (ref 36.0–46.0)
Hemoglobin: 12.9 g/dL (ref 12.0–15.0)
MCH: 27.8 pg (ref 26.0–34.0)
MCHC: 33.4 g/dL (ref 30.0–36.0)
MCV: 83.2 fL (ref 78.0–100.0)
PLATELETS: 159 10*3/uL (ref 150–400)
RBC: 4.64 MIL/uL (ref 3.87–5.11)
RDW: 14.5 % (ref 11.5–15.5)
WBC: 10 10*3/uL (ref 4.0–10.5)

## 2017-01-07 LAB — LIPASE, BLOOD: Lipase: 29 U/L (ref 11–51)

## 2017-01-07 MED ORDER — SODIUM CHLORIDE 0.9 % IV BOLUS (SEPSIS)
1000.0000 mL | Freq: Once | INTRAVENOUS | Status: AC
  Administered 2017-01-07: 1000 mL via INTRAVENOUS

## 2017-01-07 MED ORDER — LOPERAMIDE HCL 2 MG PO CAPS: 2.0000 mg | ORAL_CAPSULE | Freq: Four times a day (QID) | ORAL | 0 refills | Status: AC | PRN

## 2017-01-07 MED ORDER — ONDANSETRON 4 MG PO TBDP
4.0000 mg | ORAL_TABLET | Freq: Once | ORAL | Status: AC | PRN
  Administered 2017-01-07: 4 mg via ORAL
  Filled 2017-01-07: qty 1

## 2017-01-07 MED ORDER — ONDANSETRON 4 MG PO TBDP: 4.0000 mg | ORAL_TABLET | Freq: Three times a day (TID) | ORAL | 0 refills | Status: AC | PRN

## 2017-01-07 MED ORDER — METOCLOPRAMIDE HCL 5 MG/ML IJ SOLN
10.0000 mg | Freq: Once | INTRAMUSCULAR | Status: AC
  Administered 2017-01-07: 10 mg via INTRAVENOUS
  Filled 2017-01-07: qty 2

## 2017-01-07 NOTE — ED Notes (Signed)
Patient tolerating water well.

## 2017-01-07 NOTE — ED Triage Notes (Signed)
Pt reports 8/10 abd pain, nausea, and diarrhea.

## 2017-01-07 NOTE — ED Provider Notes (Signed)
[facility] DEPT Provider Note   CSN: [id number] Arrival date & time: 01/07/17  0014     History   Chief Complaint Chief Complaint  Patient presents with  . Abdominal Pain  . Nausea    HPI Chelsey [name] is a 51 y.o. female.  HPI   Patient is a 51 year old female with history of hypertension, depression and anemia who presents the ED with complaint of abdominal pain. Patient reports earlier this afternoon she had gradual onset of constant cramping across her lower abdomen. She endorses associated nausea and multiple episodes of nonbloody watery diarrhea. She also reports having intermittent generalized body aches. Patient reports calling her insurance's nurse on call who advised her to come to the ED for evaluation due to concern for dehydration. Denies fever, chills, chest pain, shortness of breath, vomiting, urinary symptoms, blood in urine or stool, vaginal bleeding, vaginal discharge, flank pain. Patient denies taking any medications at home for her symptoms. Denies any known sick contacts. Patient denies any recent hospitalization, recent antibiotic use, or travel outside the [place] or drinking from fresh water sources. Endorses abdominal surgical history of total hysterectomy and gastric bypass.  Past Medical History:  Diagnosis Date  . Anemia   . Anxiety   . Depression   . Headache(784.0)   . Hypertension     Patient Active Problem List   Diagnosis Date Noted  . Obesity, Class III, BMI 40-49.9 (morbid obesity) ([place]) 09/03/2011  . Bipolar 1 disorder, depressed, mild ([place]) 09/03/2011    Past Surgical History:  Procedure Laterality Date  . [place]   removal of "sweat glands"  . CHOLECYSTECTOMY    . GASTRIC BYPASS  2002    OB History    No data available       Home Medications    Prior to Admission medications   Medication Sig Start Date End Date Taking? Authorizing Provider  calcium carbonate (CALCIUM 600) 1500 (600 Ca) MG TABS tablet Take 600 mg  of elemental calcium by mouth daily with breakfast.   Yes [provider]  gabapentin (NEURONTIN) 400 MG capsule Take 400 mg by mouth 3 (three) times daily as needed (for pain).   Yes [provider]  ibuprofen (ADVIL,MOTRIN) 800 MG tablet Take 800 mg by mouth daily.   Yes [provider]  magnesium gluconate (MAGONATE) 500 MG tablet Take 500 mg by mouth daily.   Yes [provider]  Potassium 99 MG TABS Take 99 mg by mouth daily.   Yes [provider]  loperamide (IMODIUM) 2 MG capsule Take 1 capsule (2 mg total) by mouth 4 (four) times daily as needed for diarrhea or loose stools. 01/07/17   Nona Dell, PA-C  ondansetron (ZOFRAN ODT) 4 MG disintegrating tablet Take 1 tablet (4 mg total) by mouth every 8 (eight) hours as needed for nausea or vomiting. 01/07/17   Nona Dell, PA-C    Family History History reviewed. No pertinent family history.  Social History Social History  Substance Use Topics  . Smoking status: Never Smoker  . Smokeless tobacco: Never Used  . Alcohol use Yes     Comment: occasionally     Allergies   Patient has no known allergies.   Review of Systems Review of Systems  Gastrointestinal: Positive for abdominal pain, diarrhea and nausea.  Musculoskeletal: Positive for myalgias (generalized).  All other systems reviewed and are negative.    Physical Exam Updated Vital Signs BP (!) 110/52 (BP Location: Left Arm)  Pulse 68   Temp 97.4 F (36.3 C) (Oral)   Resp 18   SpO2 100%   Physical Exam  Constitutional: She is oriented to person, place, and time. She appears well-developed and well-nourished. No distress.  HENT:  Head: Normocephalic and atraumatic.  Mouth/Throat: Oropharynx is clear and moist. No oropharyngeal exudate.  Eyes: Conjunctivae and EOM are normal. Right eye exhibits no discharge. Left eye exhibits no discharge. No scleral icterus.  Neck: Normal range of motion. Neck  supple.  Cardiovascular: Normal rate, regular rhythm, normal heart sounds and intact distal pulses.   Pulmonary/Chest: Effort normal and breath sounds normal. No respiratory distress. She has no wheezes. She has no rales. She exhibits no tenderness.  Abdominal: Soft. Normal appearance and bowel sounds are normal. She exhibits no distension and no mass. There is tenderness. There is no rigidity, no rebound, no guarding and no CVA tenderness. No hernia.  Mild diffuse abdominal tenderness  Musculoskeletal: Normal range of motion. She exhibits no edema.  Neurological: She is alert and oriented to person, place, and time.  Skin: Skin is warm and dry. She is not diaphoretic.  Nursing note and vitals reviewed.    ED Treatments / Results  Labs (all labs ordered are listed, but only abnormal results are displayed) Labs Reviewed  COMPREHENSIVE METABOLIC PANEL - Abnormal; Notable for the following:       Result Value   CO2 20 (*)    Glucose, Bld 116 (*)    Calcium 8.8 (*)    All other components within normal limits  LIPASE, BLOOD  CBC    EKG  EKG Interpretation None       Radiology No results found.  Procedures Procedures (including critical care time)  Medications Ordered in ED Medications  ondansetron (ZOFRAN-ODT) disintegrating tablet 4 mg (4 mg Oral Given 01/07/17 0034)  sodium chloride 0.9 % bolus 1,000 mL (1,000 mLs Intravenous New Bag/Given 01/07/17 0142)  metoCLOPramide (REGLAN) injection 10 mg (10 mg Intravenous Given 01/07/17 0143)     Initial Impression / Assessment and Plan / ED Course  I have reviewed the triage vital signs and the nursing notes.  Pertinent labs & imaging results that were available during my care of the patient were reviewed by me and considered in my medical decision making (see chart for details).    Pt presents with lower abdominal cramping with associated nausea and watery diarrhea. Denies fever. VSS. Exam revealed mild diffuse abdominal  tenderness, no peritoneal signs. Pt given IVF and antiemetics. Pt declined pain meds. Labs unremarkable. Pt without any episodes of diarrhea while in the ED. On reevaluation patient is sitting resting comfortably in bed and reports improvement of symptoms. Tolerating PO. Suspect patient's symptoms are likely due to viral gastroenteritis. No indication of appendicitis, bowel obstruction, bowel perforation, cholecystitis, diverticulitis; do not feel that further workup or imaging is warranted at this time.  Patient discharged home with symptomatic treatment and given strict instructions for follow-up with their primary care physician.  I have also discussed reasons to return immediately to the ER.  Patient expresses understanding and agrees with plan.     Final Clinical Impressions(s) / ED Diagnoses   Final diagnoses:  Viral gastroenteritis    New Prescriptions New Prescriptions   LOPERAMIDE (IMODIUM) 2 MG CAPSULE    Take 1 capsule (2 mg total) by mouth 4 (four) times daily as needed for diarrhea or loose stools.   ONDANSETRON (ZOFRAN ODT) 4 MG DISINTEGRATING TABLET    Take 1  tablet (4 mg total) by mouth every 8 (eight) hours as needed for nausea or vomiting.     Nona Dell, PA-C 61/90/12 2241    Delora Fuel, MD 14/64/31 [phone]

## 2017-01-07 NOTE — Discharge Instructions (Signed)
Take your medications as prescribed as needed for nausea and diarrhea. Continue drinking fluids at home to remain hydrated. I recommend eating a bland diet for the next few days and your symptoms have improved. Follow-up with your primary care provider if your symptoms have not improved within the next 3-4 days. Please return to the Emergency Department if symptoms worsen or new onset of fever, chest pain, difficulty breathing, new/worsening abdominal pain, vomiting blood, unable to keep fluids down, blood in stool, syncope.

## 2018-04-21 ENCOUNTER — Emergency Department (HOSPITAL_COMMUNITY): Payer: 59

## 2018-04-21 ENCOUNTER — Encounter (HOSPITAL_COMMUNITY): Payer: Self-pay | Admitting: Emergency Medicine

## 2018-04-21 ENCOUNTER — Other Ambulatory Visit: Payer: Self-pay

## 2018-04-21 ENCOUNTER — Emergency Department (HOSPITAL_COMMUNITY)
Admission: EM | Admit: 2018-04-21 | Discharge: 2018-04-21 | Disposition: A | Discharge status: Home / Self Care | Payer: 59 | Attending: Emergency Medicine | Admitting: Emergency Medicine

## 2018-04-21 DIAGNOSIS — M62838 Other muscle spasm: Secondary | ICD-10-CM | POA: Diagnosis not present

## 2018-04-21 DIAGNOSIS — I10 Essential (primary) hypertension: Secondary | ICD-10-CM | POA: Insufficient documentation

## 2018-04-21 DIAGNOSIS — R51 Headache: Secondary | ICD-10-CM | POA: Diagnosis not present

## 2018-04-21 DIAGNOSIS — Z79899 Other long term (current) drug therapy: Secondary | ICD-10-CM | POA: Diagnosis not present

## 2018-04-21 DIAGNOSIS — R200 Anesthesia of skin: Secondary | ICD-10-CM | POA: Diagnosis present

## 2018-04-21 LAB — CBC
HEMATOCRIT: 37.6 % (ref 36.0–46.0)
HEMOGLOBIN: 11.2 g/dL — AB (ref 12.0–15.0)
MCH: 25.1 pg — ABNORMAL LOW (ref 26.0–34.0)
MCHC: 29.8 g/dL — AB (ref 30.0–36.0)
MCV: 84.1 fL (ref 78.0–100.0)
Platelets: 193 10*3/uL (ref 150–400)
RBC: 4.47 MIL/uL (ref 3.87–5.11)
RDW: 15 % (ref 11.5–15.5)
WBC: 6.5 10*3/uL (ref 4.0–10.5)

## 2018-04-21 LAB — BASIC METABOLIC PANEL
Anion gap: 7 (ref 5–15)
BUN: 10 mg/dL (ref 6–20)
CHLORIDE: 109 mmol/L (ref 98–111)
CO2: 23 mmol/L (ref 22–32)
Calcium: 8.8 mg/dL — ABNORMAL LOW (ref 8.9–10.3)
Creatinine, Ser: 0.68 mg/dL (ref 0.44–1.00)
GFR calc Af Amer: >60 mL/min (ref >60)
GFR calc non Af Amer: >60 mL/min (ref >60)
GLUCOSE: 101 mg/dL — AB (ref 70–99)
POTASSIUM: 4.6 mmol/L (ref 3.5–5.1)
Sodium: 139 mmol/L (ref 135–145)

## 2018-04-21 LAB — CBG MONITORING, ED: Glucose-Capillary: 83 mg/dL (ref 70–99)

## 2018-04-21 LAB — I-STAT BETA HCG BLOOD, ED (MC, WL, AP ONLY): I-stat hCG, quantitative: <5 m[IU]/mL (ref <5)

## 2018-04-21 NOTE — ED Notes (Signed)
Patient transported to CT 

## 2018-04-21 NOTE — ED Triage Notes (Signed)
Pt. Stated, I started having like nervous titching on my left side since last night and it hurts. From my face down it feels numb and like Im having spasms VAN negative.

## 2018-04-21 NOTE — Discharge Instructions (Signed)
Make sure you are staying hydrated and take gabapentin as needed.  Also if you develop weakness, facial drooping, difficulty speaking or walking return immediately

## 2018-04-21 NOTE — ED Notes (Signed)
Pt told by MD Maryan Rued that she was being d/c'd and left without this RN being able to go over paperwork, vitals and d/c pt.

## 2018-04-21 NOTE — ED Provider Notes (Signed)
[facility] EMERGENCY DEPARTMENT Provider Note   CSN: [id number] Arrival date & time: 04/21/18  0846     History   Chief Complaint Chief Complaint  Patient presents with  . Numbness  . Weakness    HPI Chelsey [name] is a 52 y.o. female.  Patient is a 52 year old female with a history of headaches, hypertension, depression and anemia who presents today after waking up this morning with severe pain in the left arm, left leg and some tingling in the left side of her face.  She states yesterday was a normal day and she went to bed feeling normal.  However the pain woke her up this morning.  She states that the face discomfort did not last long but then she has had persistent pain and what feels like muscle spasms in her left upper extremity and her left thigh area.  She denies weakness, facial droop, vision changes, speech difficulty, difficulty walking.  She is never had anything similar to this.  She did take a gabapentin this morning thinking it may be nerve related.  She states she does have fibromyalgia and takes Cymbalta and gabapentin for this but is never had symptoms like this.  She has no family history of MS or other neurologic diseases.  She denies any chest pain, shortness of breath, abdominal pain.  She has not had any recent illnesses.  The history is provided by the patient.    Past Medical History:  Diagnosis Date  . Anemia   . Anxiety   . Depression   . Headache(784.0)   . Hypertension     Patient Active Problem List   Diagnosis Date Noted  . Obesity, Class III, BMI 40-49.9 (morbid obesity) ([place]) 09/03/2011  . Bipolar 1 disorder, depressed, mild ([place]) 09/03/2011    Past Surgical History:  Procedure Laterality Date  . [place]   removal of "sweat glands"  . CHOLECYSTECTOMY    . GASTRIC BYPASS  2002     OB History   None      Home Medications    Prior to Admission medications   Medication Sig Start Date End Date  Taking? Authorizing Provider  DULoxetine (CYMBALTA) 60 MG capsule Take 120 mg by mouth daily. 04/13/18  Yes [provider]  gabapentin (NEURONTIN) 400 MG capsule Take 800 mg by mouth 3 (three) times daily as needed (for pain).    Yes [provider]  ibuprofen (ADVIL,MOTRIN) 800 MG tablet Take 800 mg by mouth every 8 (eight) hours as needed.    Yes [provider]  magnesium gluconate (MAGONATE) 500 MG tablet Take 500 mg by mouth daily.   Yes [provider]  Potassium 99 MG TABS Take 99 mg by mouth as needed.    Yes [provider]  loperamide (IMODIUM) 2 MG capsule Take 1 capsule (2 mg total) by mouth 4 (four) times daily as needed for diarrhea or loose stools. Patient not taking: Reported on 04/21/2018 01/07/17   Nona Dell, PA-C  ondansetron (ZOFRAN ODT) 4 MG disintegrating tablet Take 1 tablet (4 mg total) by mouth every 8 (eight) hours as needed for nausea or vomiting. Patient not taking: Reported on 04/21/2018 01/07/17   Nona Dell, PA-C    Family History No family history on file.  Social History Social History   Tobacco Use  . Smoking status: Never Smoker  . Smokeless tobacco: Never Used  Substance Use Topics  . Alcohol use: Yes  Comment: occasionally  . Drug use: No     Allergies   Patient has no known allergies.   Review of Systems Review of Systems  All other systems reviewed and are negative.    Physical Exam Updated Vital Signs BP 116/78   Pulse 60   Temp 97.7 F (36.5 C) (Oral)   Resp 16   Ht 5\' 8"  (1.727 m)   Wt 136.1 kg   SpO2 100%   BMI 45.61 kg/m   Physical Exam  Constitutional: She is oriented to person, place, and time. She appears well-developed and well-nourished. No distress.  Mildly drowsy on exam but wakes up quickly  HENT:  Head: Normocephalic and atraumatic.  Mouth/Throat: Oropharynx is clear and moist.  Eyes: Pupils are equal, round, and reactive to light.  Conjunctivae and EOM are normal.  Neck: Normal range of motion. Neck supple.  No cervical tenderness and full range of motion of the neck  Cardiovascular: Normal rate, regular rhythm and intact distal pulses.  No murmur heard. Pulmonary/Chest: Effort normal and breath sounds normal. No respiratory distress. She has no wheezes. She has no rales.  Abdominal: Soft. She exhibits no distension. There is no tenderness. There is no rebound and no guarding.  Musculoskeletal: Normal range of motion. She exhibits no edema or tenderness.  Pain in the left hand with grip but strength is normal.  Pain with range of motion of the left shoulder  Neurological: She is alert and oriented to person, place, and time. She has normal strength. No cranial nerve deficit or sensory deficit. Coordination and gait normal.  5 out of 5 strength in upper and lower extremities bilaterally.  Sensation is intact throughout.  No facial droop is noted.  No aphasia.  No pronator drift in any extremity.  Normal heel-to-shin.  No visual field cuts.  Skin: Skin is warm and dry. No rash noted. No erythema.  Psychiatric: She has a normal mood and affect. Her behavior is normal.  Nursing note and vitals reviewed.    ED Treatments / Results  Labs (all labs ordered are listed, but only abnormal results are displayed) Labs Reviewed  BASIC METABOLIC PANEL - Abnormal; Notable for the following components:      Result Value   Glucose, Bld 101 (*)    Calcium 8.8 (*)    All other components within normal limits  CBC - Abnormal; Notable for the following components:   Hemoglobin 11.2 (*)    MCH 25.1 (*)    MCHC 29.8 (*)    All other components within normal limits  URINALYSIS, ROUTINE W REFLEX MICROSCOPIC  CBG MONITORING, ED  I-STAT BETA HCG BLOOD, ED ([facility], [facility], [facility] ONLY)    EKG EKG Interpretation  Date/Time:  Saturday April 21 2018 08:54:08 EDT Ventricular Rate:  72 PR Interval:  160 QRS Duration: 74 QT  Interval:  408 QTC Calculation: 446 R Axis:   17 Text Interpretation:  Normal sinus rhythm Anteroseptal infarct , age undetermined No significant change since last tracing Confirmed by Blanchie Dessert [phone]) on 04/21/2018 11:25:33 AM   Radiology Ct Head Wo Contrast  Result Date: 04/21/2018 CLINICAL DATA:  Headache, left paresthesias EXAM: CT HEAD WITHOUT CONTRAST TECHNIQUE: Contiguous axial images were obtained from the base of the skull through the vertex without intravenous contrast. COMPARISON:  11/18/2009 FINDINGS: Brain: No evidence of acute infarction, hemorrhage, hydrocephalus, extra-axial collection or mass lesion/mass effect. Vascular: No hyperdense vessel or unexpected calcification. Skull: Normal. Negative for fracture or focal lesion. Sinuses/Orbits:  No acute finding. Other: None. IMPRESSION: Normal head CT without contrast for age Electronically Signed   By: Jerilynn Mages.  Shick M.D.   On: 04/21/2018 12:17    Procedures Procedures (including critical care time)  Medications Ordered in ED Medications - No data to display   Initial Impression / Assessment and Plan / ED Course  I have reviewed the triage vital signs and the nursing notes.  Pertinent labs & imaging results that were available during my care of the patient were reviewed by me and considered in my medical decision making (see chart for details).     Patient is a relatively healthy 52 year old female presenting today with atypical symptoms of pain and spasm in the left upper and lower extremity as well as some mild tingling of her face.  Symptoms have almost completely resolved except for some pain persistently in her left upper extremity.  She denies any chest pain shortness of breath or concern for cardiac or respiratory etiology.  Low concern for dissection or PE.  Patient is not displaying any strokelike symptoms.  We will do a CT to rule out lesions or anything concerning for MS.  Low suspicion for spinal cord lesion.   Patient is neurologically intact at this time.  Symptoms seem to be significantly better.  No electrolyte abnormality or hyperglycemia or as the cause of the spasm.  She is not complaining of headache or concern for complex migraine.  1:15 PM CT is neg.  Will d/c pt home to f/u with PCP or neurology if symptoms worsen  Final Clinical Impressions(s) / ED Diagnoses   Final diagnoses:  Muscle spasm    ED Discharge Orders    None       Blanchie Dessert, MD 04/21/18 1349

## 2020-05-12 ENCOUNTER — Encounter (HOSPITAL_COMMUNITY): Payer: Self-pay

## 2020-05-12 ENCOUNTER — Ambulatory Visit (HOSPITAL_COMMUNITY)
Admission: EM | Admit: 2020-05-12 | Discharge: 2020-05-12 | Disposition: A | Discharge status: Home / Self Care | Payer: BC Managed Care – PPO | Attending: Family Medicine | Admitting: Family Medicine

## 2020-05-12 ENCOUNTER — Other Ambulatory Visit: Payer: Self-pay

## 2020-05-12 DIAGNOSIS — G43819 Other migraine, intractable, without status migrainosus: Secondary | ICD-10-CM

## 2020-05-12 DIAGNOSIS — R0789 Other chest pain: Secondary | ICD-10-CM | POA: Diagnosis not present

## 2020-05-12 MED ORDER — ONDANSETRON 4 MG PO TBDP
4.0000 mg | ORAL_TABLET | Freq: Once | ORAL | Status: AC
  Administered 2020-05-12: 4 mg via ORAL

## 2020-05-12 MED ORDER — MELOXICAM 15 MG PO TABS: 15.0000 mg | ORAL_TABLET | Freq: Every day | ORAL | 0 refills | Status: AC

## 2020-05-12 MED ORDER — KETOROLAC TROMETHAMINE 30 MG/ML IJ SOLN
30.0000 mg | Freq: Once | INTRAMUSCULAR | Status: AC
  Administered 2020-05-12: 30 mg via INTRAMUSCULAR

## 2020-05-12 MED ORDER — ONDANSETRON 4 MG PO TBDP
ORAL_TABLET | ORAL | Status: AC
  Filled 2020-05-12: qty 1

## 2020-05-12 MED ORDER — KETOROLAC TROMETHAMINE 30 MG/ML IJ SOLN
INTRAMUSCULAR | Status: AC
  Filled 2020-05-12: qty 1

## 2020-05-12 NOTE — Discharge Instructions (Addendum)
Take the Meloxicam once a day with food Take every day for a week then reduce to as needed This is for the rib pain Follow up with your usual doctors Do not take ibuprofen while on the Meloxicam

## 2020-05-12 NOTE — ED Triage Notes (Signed)
Patient in with c/o left CP under breast area  that started last night. Pain radiates to neck and jaw and shoulder.   Also c/o sob, nausea, diaphoresis, and arm tingling  Pain is worse with deep inspiration and  palpation  Was evaluated by EMS last night and given aspirin   Denies vomiting, or numbness to extremity  EKG complete. Shown to Dr. Meda Coffee. Patient cleared to be seen and further evaluated here at [facility] per time of arrival.

## 2020-05-12 NOTE — ED Provider Notes (Signed)
[place]    CSN: [id number] Arrival date & time: 05/12/20  1254      History   Chief Complaint Chief Complaint  Patient presents with  . Chest Pain    HPI Chelsey [name] is a 54 y.o. female.   HPI   Patient is here for a migraine headache.  She has a migraine, photophobia, plus nausea.  No vomiting.  No neuro symptoms.  No head injury.  This is a typical migraine for her In addition she has chest wall pain.  It is underneath her left breast.  Hurts with deep breath.  Hurts with pressing on the area.  She has no history of cardiac disease or chest pain.  She does have history of hypertension that has been well controlled.  She was seen for chest wall pain in the emergency room in August.  Those notes are reviewed. EKG is done today.  It is reviewed with prior.  It is unchanged  Past Medical History:  Diagnosis Date  . Anemia   . Anxiety   . Depression   . Headache(784.0)   . Hypertension     Patient Active Problem List   Diagnosis Date Noted  . Obesity, Class III, BMI 40-49.9 (morbid obesity) ([place]) 09/03/2011  . Bipolar 1 disorder, depressed, mild ([place]) 09/03/2011    Past Surgical History:  Procedure Laterality Date  . [place]   removal of "sweat glands"  . CHOLECYSTECTOMY    . GASTRIC BYPASS  2002    OB History   No obstetric history on file.      Home Medications    Prior to Admission medications   Medication Sig Start Date End Date Taking? Authorizing Provider  DULoxetine (CYMBALTA) 60 MG capsule Take 120 mg by mouth daily. 04/13/18  Yes [provider]  gabapentin (NEURONTIN) 400 MG capsule Take 800 mg by mouth 3 (three) times daily as needed (for pain).    Yes [provider]  magnesium gluconate (MAGONATE) 500 MG tablet Take 500 mg by mouth daily.   Yes [provider]  Potassium 99 MG TABS Take 99 mg by mouth as needed.    Yes [provider]  ibuprofen (ADVIL,MOTRIN) 800 MG tablet Take  800 mg by mouth every 8 (eight) hours as needed.     [provider]  loperamide (IMODIUM) 2 MG capsule Take 1 capsule (2 mg total) by mouth 4 (four) times daily as needed for diarrhea or loose stools. Patient not taking: Reported on 04/21/2018 01/07/17   Nona Dell, PA-C  meloxicam (MOBIC) 15 MG tablet Take 1 tablet (15 mg total) by mouth daily. 05/12/20   Raylene Everts, MD  ondansetron (ZOFRAN ODT) 4 MG disintegrating tablet Take 1 tablet (4 mg total) by mouth every 8 (eight) hours as needed for nausea or vomiting. Patient not taking: Reported on 04/21/2018 01/07/17   Nona Dell, PA-C    Family History Family History  Problem Relation Age of Onset  . Healthy Mother   . Healthy Father     Social History Social History   Tobacco Use  . Smoking status: Never Smoker  . Smokeless tobacco: Never Used  Substance Use Topics  . Alcohol use: Yes    Comment: occasionally  . Drug use: No     Allergies   Patient has no known allergies.   Review of Systems Review of Systems See HPI  Physical Exam Triage Vital Signs ED Triage Vitals  Enc  Vitals Group     BP 05/12/20 1317 (!) 126/96     Pulse Rate 05/12/20 1317 60     Resp 05/12/20 1317 18     Temp -- 98.2     Temp src --      SpO2 05/12/20 1317 100 %     Weight --      Height --      Head Circumference --      Peak Flow --      Pain Score 05/12/20 1308 8     Pain Loc --      Pain Edu? --      Excl. in [place]? --    No data found.  Updated Vital Signs BP (!) 126/96 (BP Location: Right Arm)   Pulse 60   Resp 18   SpO2 100%      Physical Exam Constitutional:      General: She is not in acute distress.    Appearance: She is well-developed. She is obese.     Comments: Appears uncomfortable.  Squints eyes  HENT:     Head: Normocephalic and atraumatic.     Mouth/Throat:     Comments: Mask is in place Eyes:     Conjunctiva/sclera: Conjunctivae normal.     Pupils: Pupils are equal,  round, and reactive to light.     Comments: Disks are flat.  EOMI causes increased migraine pain  Cardiovascular:     Rate and Rhythm: Normal rate and regular rhythm.     Heart sounds: Normal heart sounds.  Pulmonary:     Effort: Pulmonary effort is normal. No respiratory distress.     Breath sounds: Normal breath sounds.     Comments: Chest wall tenderness on her left breast Chest:     Chest wall: Tenderness present.  Abdominal:     General: Bowel sounds are normal. There is no distension.     Palpations: Abdomen is soft.  Musculoskeletal:        General: Normal range of motion.     Cervical back: Normal range of motion and neck supple.  Skin:    General: Skin is warm and dry.  Neurological:     General: No focal deficit present.     Mental Status: She is alert.  Psychiatric:        Mood and Affect: Mood normal.        Behavior: Behavior normal.      UC Treatments / Results  Labs (all labs ordered are listed, but only abnormal results are displayed) Labs Reviewed - No data to display  EKG   Radiology No results found.  Procedures Procedures (including critical care time)  Medications Ordered in UC Medications  ketorolac (TORADOL) 30 MG/ML injection 30 mg (30 mg Intramuscular Given 05/12/20 1739)  ondansetron (ZOFRAN-ODT) disintegrating tablet 4 mg (4 mg Oral Given 05/12/20 1739)    Initial Impression / Assessment and Plan / UC Course  I have reviewed the triage vital signs and the nursing notes.  Pertinent labs & imaging results that were available during my care of the patient were reviewed by me and considered in my medical decision making (see chart for details).     Typical migraine.  Treated with Zofran ODT and a shot of Toradol.  This did give patient improvement at the time of discharge Chest wall pain.  Unclear etiology.  EKG is unchanged.  No indication for chest x-ray.  It was done 2 months ago and was normal.  We will treat her with meloxicam 15 mg  a day.  DC ibuprofen. She should follow-up with her primary care doctor Go to ER if chest pain worsens, or if headache is unimproved with medication Final Clinical Impressions(s) / UC Diagnoses   Final diagnoses:  Chest wall pain  Other migraine without status migrainosus, intractable     Discharge Instructions     Take the Meloxicam once a day with food Take every day for a week then reduce to as needed This is for the rib pain Follow up with your usual doctors Do not take ibuprofen while on the Meloxicam   ED Prescriptions    Medication Sig Dispense Auth. Provider   meloxicam (MOBIC) 15 MG tablet Take 1 tablet (15 mg total) by mouth daily. 15 tablet Raylene Everts, MD     PDMP not reviewed this encounter.   Raylene Everts, MD 05/12/20 [place]

## 2021-01-17 ENCOUNTER — Emergency Department (HOSPITAL_COMMUNITY)
Admission: EM | Admit: 2021-01-17 | Discharge: 2021-01-17 | Disposition: A | Discharge status: Home / Self Care | Payer: BC Managed Care – PPO | Attending: Emergency Medicine | Admitting: Emergency Medicine

## 2021-01-17 ENCOUNTER — Encounter (HOSPITAL_COMMUNITY): Payer: Self-pay

## 2021-01-17 ENCOUNTER — Ambulatory Visit (HOSPITAL_COMMUNITY)
Admission: EM | Admit: 2021-01-17 | Discharge: 2021-01-17 | Disposition: A | Discharge status: Home / Self Care | Payer: BC Managed Care – PPO | Attending: Student | Admitting: Student

## 2021-01-17 ENCOUNTER — Emergency Department (HOSPITAL_COMMUNITY): Payer: BC Managed Care – PPO

## 2021-01-17 DIAGNOSIS — Y900 Blood alcohol level of less than 20 mg/100 ml: Secondary | ICD-10-CM | POA: Insufficient documentation

## 2021-01-17 DIAGNOSIS — R0789 Other chest pain: Secondary | ICD-10-CM | POA: Diagnosis not present

## 2021-01-17 DIAGNOSIS — I10 Essential (primary) hypertension: Secondary | ICD-10-CM | POA: Diagnosis not present

## 2021-01-17 DIAGNOSIS — Z79899 Other long term (current) drug therapy: Secondary | ICD-10-CM | POA: Insufficient documentation

## 2021-01-17 DIAGNOSIS — Z20822 Contact with and (suspected) exposure to covid-19: Secondary | ICD-10-CM | POA: Diagnosis not present

## 2021-01-17 DIAGNOSIS — R42 Dizziness and giddiness: Secondary | ICD-10-CM | POA: Diagnosis not present

## 2021-01-17 DIAGNOSIS — S39012A Strain of muscle, fascia and tendon of lower back, initial encounter: Secondary | ICD-10-CM

## 2021-01-17 DIAGNOSIS — M62830 Muscle spasm of back: Secondary | ICD-10-CM

## 2021-01-17 DIAGNOSIS — X500XXA Overexertion from strenuous movement or load, initial encounter: Secondary | ICD-10-CM | POA: Diagnosis not present

## 2021-01-17 DIAGNOSIS — M542 Cervicalgia: Secondary | ICD-10-CM | POA: Diagnosis not present

## 2021-01-17 DIAGNOSIS — M545 Low back pain, unspecified: Secondary | ICD-10-CM | POA: Diagnosis present

## 2021-01-17 LAB — DIFFERENTIAL
Abs Immature Granulocytes: 0.01 10*3/uL (ref 0.00–0.07)
Basophils Absolute: 0 10*3/uL (ref 0.0–0.1)
Basophils Relative: 0 %
Eosinophils Absolute: 0.3 10*3/uL (ref 0.0–0.5)
Eosinophils Relative: 4 %
Immature Granulocytes: 0 %
Lymphocytes Relative: 37 %
Lymphs Abs: 2.5 10*3/uL (ref 0.7–4.0)
Monocytes Absolute: 0.4 10*3/uL (ref 0.1–1.0)
Monocytes Relative: 6 %
Neutro Abs: 3.6 10*3/uL (ref 1.7–7.7)
Neutrophils Relative %: 53 %

## 2021-01-17 LAB — APTT: aPTT: 24 seconds (ref 24–36)

## 2021-01-17 LAB — COMPREHENSIVE METABOLIC PANEL
ALT: 18 U/L (ref 0–44)
AST: 22 U/L (ref 15–41)
Albumin: 3.4 g/dL — ABNORMAL LOW (ref 3.5–5.0)
Alkaline Phosphatase: 112 U/L (ref 38–126)
Anion gap: 7 (ref 5–15)
BUN: 11 mg/dL (ref 6–20)
CO2: 25 mmol/L (ref 22–32)
Calcium: 8.9 mg/dL (ref 8.9–10.3)
Chloride: 109 mmol/L (ref 98–111)
Creatinine, Ser: 0.68 mg/dL (ref 0.44–1.00)
GFR, Estimated: >60 mL/min (ref >60)
Glucose, Bld: 103 mg/dL — ABNORMAL HIGH (ref 70–99)
Potassium: 4 mmol/L (ref 3.5–5.1)
Sodium: 141 mmol/L (ref 135–145)
Total Bilirubin: 0.7 mg/dL (ref 0.3–1.2)
Total Protein: 6.9 g/dL (ref 6.5–8.1)

## 2021-01-17 LAB — CBG MONITORING, ED: Glucose-Capillary: 107 mg/dL — ABNORMAL HIGH (ref 70–99)

## 2021-01-17 LAB — CBC
HCT: 37.8 % (ref 36.0–46.0)
Hemoglobin: 11.4 g/dL — ABNORMAL LOW (ref 12.0–15.0)
MCH: 25.6 pg — ABNORMAL LOW (ref 26.0–34.0)
MCHC: 30.2 g/dL (ref 30.0–36.0)
MCV: 84.9 fL (ref 80.0–100.0)
Platelets: 225 10*3/uL (ref 150–400)
RBC: 4.45 MIL/uL (ref 3.87–5.11)
RDW: 16.4 % — ABNORMAL HIGH (ref 11.5–15.5)
WBC: 6.8 10*3/uL (ref 4.0–10.5)
nRBC: 0 % (ref 0.0–0.2)

## 2021-01-17 LAB — I-STAT CHEM 8, ED
BUN: 12 mg/dL (ref 6–20)
Calcium, Ion: 1.17 mmol/L (ref 1.15–1.40)
Chloride: 107 mmol/L (ref 98–111)
Creatinine, Ser: 0.6 mg/dL (ref 0.44–1.00)
Glucose, Bld: 100 mg/dL — ABNORMAL HIGH (ref 70–99)
HCT: 37 % (ref 36.0–46.0)
Hemoglobin: 12.6 g/dL (ref 12.0–15.0)
Potassium: 3.8 mmol/L (ref 3.5–5.1)
Sodium: 143 mmol/L (ref 135–145)
TCO2: 25 mmol/L (ref 22–32)

## 2021-01-17 LAB — RESP PANEL BY RT-PCR (FLU A&B, COVID) ARPGX2
Influenza A by PCR: NEGATIVE
Influenza B by PCR: NEGATIVE
SARS Coronavirus 2 by RT PCR: NEGATIVE

## 2021-01-17 LAB — PROTIME-INR
INR: 1.1 (ref 0.8–1.2)
Prothrombin Time: 13.9 seconds (ref 11.4–15.2)

## 2021-01-17 LAB — ETHANOL: Alcohol, Ethyl (B): <10 mg/dL (ref <10)

## 2021-01-17 MED ORDER — IOHEXOL 350 MG/ML SOLN
150.0000 mL | Freq: Once | INTRAVENOUS | Status: AC | PRN
  Administered 2021-01-17: 150 mL via INTRAVENOUS

## 2021-01-17 MED ORDER — DIAZEPAM 5 MG PO TABS
5.0000 mg | ORAL_TABLET | Freq: Once | ORAL | Status: AC
  Administered 2021-01-17: 5 mg via ORAL
  Filled 2021-01-17: qty 1

## 2021-01-17 MED ORDER — LIDOCAINE 5 % EX PTCH
1.0000 | MEDICATED_PATCH | CUTANEOUS | Status: DC
  Administered 2021-01-17: 1 via TRANSDERMAL
  Filled 2021-01-17: qty 1

## 2021-01-17 MED ORDER — KETOROLAC TROMETHAMINE 15 MG/ML IJ SOLN
15.0000 mg | Freq: Once | INTRAMUSCULAR | Status: AC
  Administered 2021-01-17: 15 mg via INTRAVENOUS
  Filled 2021-01-17: qty 1

## 2021-01-17 MED ORDER — IBUPROFEN 800 MG PO TABS: 800.0000 mg | ORAL_TABLET | Freq: Three times a day (TID) | ORAL | 0 refills | Status: AC

## 2021-01-17 MED ORDER — ACETAMINOPHEN 500 MG PO TABS: 1000.0000 mg | ORAL_TABLET | Freq: Four times a day (QID) | ORAL | 0 refills | Status: AC

## 2021-01-17 MED ORDER — LIDOCAINE 4 % EX PTCH: 1.0000 | MEDICATED_PATCH | Freq: Every day | CUTANEOUS | 0 refills | Status: AC

## 2021-01-17 MED ORDER — ACETAMINOPHEN 500 MG PO TABS
1000.0000 mg | ORAL_TABLET | Freq: Once | ORAL | Status: DC
  Filled 2021-01-17: qty 2

## 2021-01-17 NOTE — ED Provider Notes (Signed)
[place]    CSN: [id number] Arrival date & time: 01/17/21  1041      History   Chief Complaint Chief Complaint  Patient presents with   Back Pain   Leg Pain    HPI Kayliee Atienza is a 55 y.o. female presenting with dizziness, left-sided weakness, left leg pain, lower back pain.  States symptoms started after lifting a heavy pack of water few days ago.  Patient also complains of constant dizziness, nausea without vomiting.  States that she feels like she is slurring her words.  Denies chest pain, shortness of breath.  Denies headaches, vision changes.  HPI  Past Medical History:  Diagnosis Date   Anemia    Anxiety    Depression    Headache(784.0)    Hypertension     Patient Active Problem List   Diagnosis Date Noted   Obesity, Class III, BMI 40-49.9 (morbid obesity) ([place]) 09/03/2011   Bipolar 1 disorder, depressed, mild ([place]) 09/03/2011    Past Surgical History:  Procedure Laterality Date   AXILLARY SURGERY  1997   removal of "sweat glands"   CHOLECYSTECTOMY     GASTRIC BYPASS  2002    OB History   No obstetric history on file.      Home Medications    Prior to Admission medications   Medication Sig Start Date End Date Taking? Authorizing Provider  hydrochlorothiazide (HYDRODIURIL) 25 MG tablet Take 1 tablet by mouth daily. 06/29/20  Yes [provider]  DULoxetine (CYMBALTA) 60 MG capsule Take 120 mg by mouth daily. 04/13/18   [provider]  gabapentin (NEURONTIN) 400 MG capsule Take 800 mg by mouth 3 (three) times daily as needed (for pain).     [provider]  ibuprofen (ADVIL,MOTRIN) 800 MG tablet Take 800 mg by mouth every 8 (eight) hours as needed.     [provider]  loperamide (IMODIUM) 2 MG capsule Take 1 capsule (2 mg total) by mouth 4 (four) times daily as needed for diarrhea or loose stools. Patient not taking: Reported on 04/21/2018 01/07/17   Nona Dell, PA-C  magnesium gluconate  (MAGONATE) 500 MG tablet Take 500 mg by mouth daily.    [provider]  meloxicam (MOBIC) 15 MG tablet Take 1 tablet (15 mg total) by mouth daily. 05/12/20   Raylene Everts, MD  ondansetron (ZOFRAN ODT) 4 MG disintegrating tablet Take 1 tablet (4 mg total) by mouth every 8 (eight) hours as needed for nausea or vomiting. Patient not taking: Reported on 04/21/2018 01/07/17   Nona Dell, PA-C  Potassium 99 MG TABS Take 99 mg by mouth as needed.     [provider]  tiZANidine (ZANAFLEX) 4 MG tablet Take 4 mg by mouth every 8 (eight) hours as needed. 12/15/20   [provider]  traZODone (DESYREL) 50 MG tablet trazodone 50 mg tablet    [provider]    Family History Family History  Problem Relation Age of Onset   Healthy Mother    Healthy Father     Social History Social History   Tobacco Use   Smoking status: Never   Smokeless tobacco: Never  Substance Use Topics   Alcohol use: Yes    Comment: occasionally   Drug use: No     Allergies   Patient has no known allergies.   Review of Systems Review of Systems  Constitutional:  Negative for appetite change, chills and fever.  HENT:  Negative for  congestion, ear pain, rhinorrhea, sinus pressure, sinus pain and sore throat.   Eyes:  Negative for redness and visual disturbance.  Respiratory:  Negative for cough, chest tightness, shortness of breath and wheezing.   Cardiovascular:  Negative for chest pain and palpitations.  Gastrointestinal:  Negative for abdominal pain, constipation, diarrhea, nausea and vomiting.  Genitourinary:  Negative for dysuria, frequency and urgency.  Musculoskeletal:  Positive for back pain. Negative for myalgias.  Neurological:  Positive for dizziness. Negative for weakness and headaches.  Psychiatric/Behavioral:  Negative for confusion.   All other systems reviewed and are negative.   Physical Exam Triage Vital Signs ED Triage Vitals  Enc Vitals  Group     BP 01/17/21 1119 104/77     Pulse Rate 01/17/21 1119 87     Resp 01/17/21 1119 20     Temp 01/17/21 1119 98 F (36.7 C)     Temp Source 01/17/21 1119 Oral     SpO2 01/17/21 1119 100 %     Weight --      Height --      Head Circumference --      Peak Flow --      Pain Score 01/17/21 1115 10     Pain Loc --      Pain Edu? --      Excl. in [place]? --    No data found.  Updated Vital Signs BP 104/77 (BP Location: Right Arm)   Pulse 87   Temp 98 F (36.7 C) (Oral)   Resp 20   SpO2 100%   Visual Acuity Right Eye Distance:   Left Eye Distance:   Bilateral Distance:    Right Eye Near:   Left Eye Near:    Bilateral Near:     Physical Exam Vitals reviewed.  Constitutional:      General: She is not in acute distress.    Appearance: Normal appearance. She is not ill-appearing.  HENT:     Head: Normocephalic and atraumatic.     Right Ear: Hearing, tympanic membrane, ear canal and external ear normal. No swelling or tenderness. There is no impacted cerumen. No mastoid tenderness. Tympanic membrane is not perforated, erythematous, retracted or bulging.     Left Ear: Hearing, tympanic membrane, ear canal and external ear normal. No swelling or tenderness. There is no impacted cerumen. No mastoid tenderness. Tympanic membrane is not perforated, erythematous, retracted or bulging.     Nose:     Right Sinus: No maxillary sinus tenderness or frontal sinus tenderness.     Left Sinus: No maxillary sinus tenderness or frontal sinus tenderness.     Mouth/Throat:     Mouth: Mucous membranes are moist.     Pharynx: Uvula midline. No oropharyngeal exudate or posterior oropharyngeal erythema.     Tonsils: No tonsillar exudate.  Eyes:     Extraocular Movements: Extraocular movements intact.     Pupils: Pupils are equal, round, and reactive to light.  Cardiovascular:     Rate and Rhythm: Normal rate and regular rhythm.     Heart sounds: Normal heart sounds.  Pulmonary:     Breath  sounds: Normal breath sounds and air entry. No wheezing, rhonchi or rales.  Chest:     Chest wall: No tenderness.  Abdominal:     General: Abdomen is flat. Bowel sounds are normal.     Tenderness: There is no abdominal tenderness. There is no guarding or rebound.  Lymphadenopathy:     Cervical: No cervical  adenopathy.  Skin:    Capillary Refill: Capillary refill takes less than 2 seconds.  Neurological:     General: No focal deficit present.     Mental Status: She is alert and oriented to person, place, and time.     Cranial Nerves: Cranial nerves are intact. No cranial nerve deficit.     Sensory: Sensation is intact. No sensory deficit.     Motor: Motor function is intact. No weakness.     Coordination: Coordination is intact.     Gait: Gait is intact. Gait normal.     Comments: CN 2-12 grossly intact  Psychiatric:        Attention and Perception: Attention and perception normal.        Mood and Affect: Mood and affect normal.        Behavior: Behavior normal. Behavior is cooperative.        Thought Content: Thought content normal.        Judgment: Judgment normal.     UC Treatments / Results  Labs (all labs ordered are listed, but only abnormal results are displayed) Labs Reviewed  CBG MONITORING, ED    EKG   Radiology No results found.  Procedures Procedures (including critical care time)  Medications Ordered in UC Medications - No data to display  Initial Impression / Assessment and Plan / UC Course  I have reviewed the triage vital signs and the nursing notes.  Pertinent labs & imaging results that were available during my care of the patient were reviewed by me and considered in my medical decision making (see chart for details).     This patient is a 55 year old female presenting with dizziness.  She states she is slurring her words and has left-sided weakness, though this is not apparent on exam.  EKG NSR, comparable to 2021 EKG.  Nonfasting CBG 107.   Planned to send this patient to the emergency department via EMS, but she declines this multiple times.  She states that her husband will drive her to the emergency department.  She understands she could be having an acute stroke, and that she could have lasting deficits by declining this transport.  Final Clinical Impressions(s) / UC Diagnoses   Final diagnoses:  Dizziness     Discharge Instructions      -Your symptoms raise concern for acute stroke.  We recommend that you head to the emergency department via ambulance.  You have declined this, so make sure that your husband or daughter drives the vehicle and head straight to the emergency department.  If you are having a stroke, every second counts, so is important that you had straight there.   ED Prescriptions   None    PDMP not reviewed this encounter.   Hazel Sams, PA-C 01/17/21 1138

## 2021-01-17 NOTE — ED Provider Notes (Signed)
[facility] EMERGENCY DEPARTMENT Provider Note   CSN: [id number] Arrival date & time: 01/17/21  1204     History Chief Complaint  Patient presents with   Dizziness    Chelsey [name] is a 55 y.o. female past medical history of cholecystectomy, hypertension, anxiety, anemia, presenting from urgent care for evaluation.  She states on Thursday she lifted a case of water and noticed some left lower back pain.  She has baseline sciatica and thought this exacerbated her pain.  She was treating her symptoms over the following day and slept most of Friday.  However symptoms worsened yesterday.  Pain is in her left low back down into her left thigh and radiating up into her left shoulder and "out through my mouth."  She has symptoms in her neck and throat.  She is not having anterior chest pain or abdominal pain, nor is she having SOB.  She states yesterday in the afternoon she began feeling heaviness to her left arm and leg and felt like she was "dragging them around."  She finally fell asleep after tossing and turning around 2 AM this morning and when she woke up in the morning she noticed the left side of her face was numb and she was having room spinning dizziness with ambulation with difficulty with her balance.  She also felt like her speech was slurred though that has been improving over the morning.  She went to urgent care for evaluation who sent her here for stroke work-up.    The history is provided by the patient.      Past Medical History:  Diagnosis Date   Anemia    Anxiety    Depression    Headache(784.0)    Hypertension     Patient Active Problem List   Diagnosis Date Noted   Obesity, Class III, BMI 40-49.9 (morbid obesity) ([place]) 09/03/2011   Bipolar 1 disorder, depressed, mild ([place]) 09/03/2011    Past Surgical History:  Procedure Laterality Date   AXILLARY SURGERY  1997   removal of "sweat glands"   CHOLECYSTECTOMY     GASTRIC BYPASS  2002     OB  History   No obstetric history on file.     Family History  Problem Relation Age of Onset   Healthy Mother    Healthy Father     Social History   Tobacco Use   Smoking status: Never   Smokeless tobacco: Never  Vaping Use   Vaping Use: Never used  Substance Use Topics   Alcohol use: Yes    Comment: occasionally   Drug use: No    Home Medications Prior to Admission medications   Medication Sig Start Date End Date Taking? Authorizing Provider  acetaminophen (TYLENOL) 500 MG tablet Take 1,000 mg by mouth every 4 (four) hours as needed (for pain).   Yes [provider]  DULoxetine (CYMBALTA) 60 MG capsule Take 120 mg by mouth daily. 04/13/18  Yes [provider]  gabapentin (NEURONTIN) 400 MG capsule Take 400-800 mg by mouth See admin instructions. Take 800 mg by mouth in the morning & at bedtime and an additional 400 mg every four hours as needed for pain   Yes [provider]  hydrochlorothiazide (HYDRODIURIL) 25 MG tablet Take 25 mg by mouth daily. 06/29/20  Yes [provider]  magnesium oxide (MAG-OX) 400 MG tablet Take 400 mg by mouth daily.   Yes [provider]  Misc Natural Products ([place])  CAPS Take 1 capsule by mouth daily.   Yes [provider]  Potassium 99 MG TABS Take 99 mg by mouth daily with breakfast.   Yes [provider]  tiZANidine (ZANAFLEX) 4 MG tablet Take 4 mg by mouth every 8 (eight) hours as needed for muscle spasms. 12/15/20  Yes [provider]  ibuprofen (ADVIL,MOTRIN) 800 MG tablet Take 800 mg by mouth every 8 (eight) hours as needed.     [provider]  loperamide (IMODIUM) 2 MG capsule Take 1 capsule (2 mg total) by mouth 4 (four) times daily as needed for diarrhea or loose stools. Patient not taking: Reported on 01/17/2021 01/07/17   Nona Dell, PA-C  magnesium gluconate (MAGONATE) 500 MG tablet Take 500 mg by mouth daily.    [provider]  meloxicam (MOBIC) 15 MG tablet Take 1 tablet (15 mg total) by mouth daily. Patient not taking: No sig reported 05/12/20   Raylene Everts, MD  ondansetron (ZOFRAN ODT) 4 MG disintegrating tablet Take 1 tablet (4 mg total) by mouth every 8 (eight) hours as needed for nausea or vomiting. Patient not taking: Reported on 01/17/2021 01/07/17   Nona Dell, PA-C  traZODone (DESYREL) 50 MG tablet Take 50 mg by mouth at bedtime.    [provider]    Allergies    Pregabalin  Review of Systems   Review of Systems  Eyes:  Negative for visual disturbance.  Respiratory:  Negative for shortness of breath.   Cardiovascular:  Negative for chest pain.  Gastrointestinal:  Negative for abdominal pain.  Genitourinary:  Negative for difficulty urinating.  Musculoskeletal:  Positive for back pain.  Neurological:  Positive for dizziness, speech difficulty, weakness and numbness.  All other systems reviewed and are negative.  Physical Exam Updated Vital Signs BP (!) 113/56 (BP Location: Left Arm)   Pulse 62   Temp 98.9 F (37.2 C) (Oral)   Resp 14   Ht 5\' 8"  (1.727 m)   Wt (!) 140.6 kg   SpO2 100%   BMI 47.14 kg/m   Physical Exam Vitals and nursing note reviewed.  Constitutional:      Appearance: She is well-developed.  HENT:     Head: Normocephalic and atraumatic.  Eyes:     Extraocular Movements: Extraocular movements intact.     Conjunctiva/sclera: Conjunctivae normal.     Pupils: Pupils are equal, round, and reactive to light.  Cardiovascular:     Rate and Rhythm: Normal rate and regular rhythm.  Pulmonary:     Effort: Pulmonary effort is normal. No respiratory distress.     Breath sounds: Normal breath sounds.  Chest:     Chest wall: Tenderness (L upper pectoral region) present.  Abdominal:     General: Bowel sounds are normal.     Palpations: Abdomen is soft.     Tenderness: There is no abdominal tenderness.  Musculoskeletal:     Cervical  back: Normal range of motion and neck supple. No tenderness.     Comments: TTP to left paraspinal musculature of the entire back.  Skin:    General: Skin is warm.  Neurological:     Mental Status: She is alert.     Comments: Mental Status:  Alert, oriented, thought content appropriate, able to give a coherent history. Speech fluent without evidence of aphasia. Able to follow 2 step commands without difficulty.  Cranial Nerves:  II:  Peripheral visual fields grossly normal, pupils equal, round, reactive to light  III,IV, VI: ptosis not present, extra-ocular motions intact bilaterally  V,VII: smile symmetric, facial light touch sensation decreased to all 3 distributions on left face VIII: hearing grossly normal to voice  X: uvula elevates symmetrically  XI: less strength with head turn to left XII: midline tongue extension without fassiculations Motor:  Normal tone. 4/5 strength in LUE with grip, 5/5 RUE. 5/5 strength BLE dorsiflexion/plantar flexion Sensory: decreased in LUE, normal all other extremities.  Cerebellar: normal finger-to-nose with bilateral upper extremities No appreciable pronator drift Gait: deferred due to pain CV: distal pulses palpable throughout     Psychiatric:        Behavior: Behavior normal.    ED Results / Procedures / Treatments   Labs (all labs ordered are listed, but only abnormal results are displayed) Labs Reviewed  CBC - Abnormal; Notable for the following components:      Result Value   Hemoglobin 11.4 (*)    MCH 25.6 (*)    RDW 16.4 (*)    All other components within normal limits  COMPREHENSIVE METABOLIC PANEL - Abnormal; Notable for the following components:   Glucose, Bld 103 (*)    Albumin 3.4 (*)    All other components within normal limits  I-STAT CHEM 8, ED - Abnormal; Notable for the following components:   Glucose, Bld 100 (*)    All other components within normal limits  RESP PANEL BY RT-PCR (FLU A&B, COVID) ARPGX2  ETHANOL   PROTIME-INR  APTT  DIFFERENTIAL  RAPID URINE DRUG SCREEN, HOSP PERFORMED  URINALYSIS, ROUTINE W REFLEX MICROSCOPIC    EKG None  Radiology No results found.  Procedures Procedures   Medications Ordered in ED Medications  iohexol (OMNIPAQUE) 350 MG/ML injection 150 mL (150 mLs Intravenous Contrast Given 01/17/21 1443)    ED Course  I have reviewed the triage vital signs and the nursing notes.  Pertinent labs & imaging results that were available during my care of the patient were reviewed by me and considered in my medical decision making (see chart for details).  Clinical Course as of 01/17/21 1519  Sun Jan 17, 2021  1245 Discussed patient presentation and exam findings with attending physician Dr. Hillard Danker.  Patient does not fit code stroke window.  For this reason code stroke is not initiated.  However will do stroke work-up as well as evaluate for dissection. [JR]  7628 Taking handoff from [place].  Low back pain with lifting starting 4 days ago. Concern for dissection - CTA negative. Also had some stroke symptoms starting yesterday which are improving. Left arm weakness, left face weakness. Outside of the window for code stroke. CTA head and neck. Will need MR brain if CTA negative. [ZB]    Clinical Course User Index [JR] Aunika Kirsten, [place] N, PA-C [ZB] Pearson Grippe, DO   MDM Rules/Calculators/A&P                          Patient is a 55 year old female presenting from urgent care for evaluation of potential stroke.  Has began with left lower back pain on Thursday after lifting a case of water.  Progressively worsened down into the left thigh and all the way up her left back into her neck and throat.  No anterior chest or abdominal pain.  She developed neurologic symptoms yesterday with heaviness to her left arm and leg, and then upon awakening this morning with numbness to her left face, dizziness with feeling off balance and  some slurred speech which has improved.  She  is outside tPA window, therefore code stroke was not initiated.  There is also concern for dissection considering patient's presentation.  CTA imaging of the head, neck, and chest abdomen pelvis is ordered along with blood work.  Blood work is overall unremarkable.  CTA imaging pending though independently reviewed and interpreted by myself without obvious evidence of dissection.  Will await radiology read.  If negative, patient will need MRI imaging to rule out stroke and neuro consultation.  Care is handed off at shift change to Dr. Jenean Lindau.   Final Clinical Impression(s) / ED Diagnoses Final diagnoses:  None    Rx / DC Orders ED Discharge Orders     None        Shevaun Lovan, [place] N, PA-C 01/17/21 1520    Dorie Rank, MD 01/19/21 [phone]

## 2021-01-17 NOTE — Discharge Instructions (Addendum)
You have a lumbar strain on the left side with muscle spasm. I have written you several prescriptions that I want you to take as written. Please continue taking your home pain medications as prescribed.

## 2021-01-17 NOTE — ED Notes (Signed)
Pt refused tylenol. Wants something else for pain. Pt wants to talk to MD. MD notified.

## 2021-01-17 NOTE — Discharge Instructions (Addendum)
-  Your symptoms raise concern for acute stroke.  We recommend that you head to the emergency department via ambulance.  You have declined this, so make sure that your husband or daughter drives the vehicle and head straight to the emergency department.  If you are having a stroke, every second counts, so is important that you had straight there.

## 2021-01-17 NOTE — ED Provider Notes (Signed)
  Physical Exam  BP 119/86   Pulse 61   Temp 98 F (36.7 C) (Oral)   Resp (!) 21   Ht 5\' 8"  (1.727 m)   Wt (!) 140.6 kg   SpO2 96%   BMI 47.14 kg/m   Physical Exam Eyes:     General: No visual field deficit. Musculoskeletal:     Comments:  Left lumbar paraspinal muscle spasm. No overlying skin changes or wounds. When palpated on exam, she says this is a reproduction of her pain that resulted in her presentation.    Neurological:     Mental Status: She is oriented to person, place, and time.     GCS: GCS eye subscore is 4. GCS verbal subscore is 5. GCS motor subscore is 6.     Cranial Nerves: Cranial nerves are intact. No cranial nerve deficit, dysarthria or facial asymmetry.     Sensory: Sensation is intact. No sensory deficit.     Motor: Motor function is intact. No weakness, tremor, atrophy, abnormal muscle tone, seizure activity or pronator drift.     Coordination: Coordination is intact. Romberg sign negative. Finger-Nose-Finger Test and Heel to [facility] Test normal.     Deep Tendon Reflexes: Reflexes are normal and symmetric.     Comments:      ED Course/Procedures   Clinical Course as of 01/18/21 1511  Sun Jan 17, 2021  1245 Discussed patient presentation and exam findings with attending physician Dr. Hillard Danker.  Patient does not fit code stroke window.  For this reason code stroke is not initiated.  However will do stroke work-up as well as evaluate for dissection. [JR]  8341 Taking handoff from [place].  Low back pain with lifting starting 4 days ago. Concern for dissection - CTA negative. Also had some stroke symptoms starting yesterday which are improving. Left arm weakness, left face weakness. Outside of the window for code stroke. CTA head and neck. Will need MR brain if CTA negative. [ZB]    Clinical Course User Index [JR] Robinson, [place] N, PA-C [ZB] Pearson Grippe, DO    Procedures  MDM   I received handoff on this patient at shift change.  Please see PA  note for full details.  I reviewed all imaging and was considered in my decision making.  No evidence of acute stroke, intra-abdominal/intrathoracic or cervical vessel dissection or aneurysm.  My assessment based on an in-depth discussion with the patient and physical exam is that she has a left lower lumbar paraspinal muscle strain with muscle spasm causing pain.  I have very low index of suspicion for cauda equina syndrome. Also have a very low suspicion for stroke based on my exam.  Will treat symptomatically for musculoskeletal strain, stretching and rest.  She will follow-up with her primary care doctor.      Pearson Grippe, DO 01/18/21 1516    Pattricia Boss, MD 01/19/21 [phone]

## 2021-01-17 NOTE — ED Triage Notes (Signed)
Pt in with c/l left side weakness, left leg pain and lower back pain  Pt states she felt like she may have pulled muscle after lifting heavy pack of water   Pt c/o dizziness, nausea and states " I feel like I'm slurring my words"  Denies any CP, SOB

## 2021-01-17 NOTE — ED Notes (Signed)
Patient is being discharged from the Urgent Care and sent to the Emergency Department via pov . Per Marin Roberts, PA, patient is in need of higher level of care due to left side weakness, nausea, dizziness. Patient is aware and verbalizes understanding of plan of care.  Vitals:   01/17/21 1119 01/17/21 1119  BP: 104/77   Pulse: 87   Resp:  20  Temp: 98 F (36.7 C)   SpO2: 100%

## 2021-01-17 NOTE — ED Notes (Signed)
Reports lower back pain and left leg pain and weakness on Thursday. Dizziness that started yesterday. Reports feeling like slurred speech that started upon waking up today at 6AM with numbness and weakness to left arm. Pt is alert and oriented x 4. Ambulatory but holds on to furniture due to dizziness. Denies hx of stroke. Will continue to monitor.

## 2021-01-17 NOTE — ED Triage Notes (Signed)
Here for left sided weakness, lower back pain and leg pain that started Thursday. Dizziness started yesterday. Feels like she has slurred speech that started at 6AM today. Alert and oriented x 4.

## 2021-05-28 ENCOUNTER — Emergency Department (HOSPITAL_COMMUNITY)
Admission: EM | Admit: 2021-05-28 | Discharge: 2021-05-28 | Disposition: A | Discharge status: Home / Self Care | Payer: No Typology Code available for payment source | Attending: Emergency Medicine | Admitting: Emergency Medicine

## 2021-05-28 ENCOUNTER — Emergency Department (HOSPITAL_COMMUNITY): Payer: No Typology Code available for payment source

## 2021-05-28 ENCOUNTER — Encounter (HOSPITAL_COMMUNITY): Payer: Self-pay | Admitting: Emergency Medicine

## 2021-05-28 DIAGNOSIS — S9002XA Contusion of left ankle, initial encounter: Secondary | ICD-10-CM | POA: Insufficient documentation

## 2021-05-28 DIAGNOSIS — Z79899 Other long term (current) drug therapy: Secondary | ICD-10-CM | POA: Insufficient documentation

## 2021-05-28 DIAGNOSIS — M25512 Pain in left shoulder: Secondary | ICD-10-CM | POA: Insufficient documentation

## 2021-05-28 DIAGNOSIS — M25551 Pain in right hip: Secondary | ICD-10-CM | POA: Insufficient documentation

## 2021-05-28 DIAGNOSIS — Y92410 Unspecified street and highway as the place of occurrence of the external cause: Secondary | ICD-10-CM | POA: Diagnosis not present

## 2021-05-28 DIAGNOSIS — M79672 Pain in left foot: Secondary | ICD-10-CM | POA: Insufficient documentation

## 2021-05-28 DIAGNOSIS — S99912A Unspecified injury of left ankle, initial encounter: Secondary | ICD-10-CM | POA: Diagnosis present

## 2021-05-28 DIAGNOSIS — I10 Essential (primary) hypertension: Secondary | ICD-10-CM | POA: Insufficient documentation

## 2021-05-28 NOTE — ED Notes (Signed)
DC instructions reviewed with pt. PT verbalized understanding. Pt DC °
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
## 2022-06-01 ENCOUNTER — Other Ambulatory Visit (HOSPITAL_BASED_OUTPATIENT_CLINIC_OR_DEPARTMENT_OTHER): Payer: Self-pay | Admitting: *Deleted

## 2022-06-01 DIAGNOSIS — Z1231 Encounter for screening mammogram for malignant neoplasm of breast: Secondary | ICD-10-CM

## 2022-06-06 ENCOUNTER — Ambulatory Visit (HOSPITAL_BASED_OUTPATIENT_CLINIC_OR_DEPARTMENT_OTHER)
Admission: RE | Admit: 2022-06-06 | Discharge: 2022-06-06 | Disposition: A | Discharge status: Home / Self Care | Payer: Medicaid Other | Source: Ambulatory Visit | Attending: *Deleted | Admitting: *Deleted

## 2022-06-06 ENCOUNTER — Encounter (HOSPITAL_BASED_OUTPATIENT_CLINIC_OR_DEPARTMENT_OTHER): Payer: Self-pay

## 2022-06-06 DIAGNOSIS — Z1231 Encounter for screening mammogram for malignant neoplasm of breast: Secondary | ICD-10-CM | POA: Insufficient documentation

## 2022-06-09 ENCOUNTER — Other Ambulatory Visit: Payer: Self-pay | Admitting: *Deleted

## 2022-06-09 DIAGNOSIS — R928 Other abnormal and inconclusive findings on diagnostic imaging of breast: Secondary | ICD-10-CM

## 2022-06-30 ENCOUNTER — Ambulatory Visit: Payer: Medicaid Other

## 2022-06-30 ENCOUNTER — Ambulatory Visit
Admission: RE | Admit: 2022-06-30 | Discharge: 2022-06-30 | Disposition: A | Discharge status: Home / Self Care | Payer: Medicaid Other | Source: Ambulatory Visit | Attending: *Deleted | Admitting: *Deleted

## 2022-06-30 DIAGNOSIS — R928 Other abnormal and inconclusive findings on diagnostic imaging of breast: Secondary | ICD-10-CM
